# Patient Record
Sex: Male | Born: 1980 | Race: Black or African American | Hispanic: No | Marital: Single | Smoking: Never smoker
Health system: Southern US, Community
[De-identification: ages and names within clinical notes are randomized; demographics above are authoritative.]

## PROBLEM LIST (undated history)

## (undated) DIAGNOSIS — I2699 Other pulmonary embolism without acute cor pulmonale: Secondary | ICD-10-CM

---
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
## 2001-10-26 ENCOUNTER — Emergency Department (HOSPITAL_COMMUNITY): Admission: EM | Admit: 2001-10-26 | Discharge: 2001-10-27 | Payer: Self-pay | Admitting: Emergency Medicine

## 2002-12-09 ENCOUNTER — Emergency Department (HOSPITAL_COMMUNITY): Admission: EM | Admit: 2002-12-09 | Discharge: 2002-12-09 | Payer: Self-pay

## 2010-03-26 ENCOUNTER — Emergency Department (HOSPITAL_COMMUNITY): Admission: EM | Admit: 2010-03-26 | Discharge: 2010-03-26 | Payer: Self-pay | Admitting: Emergency Medicine

## 2010-06-29 ENCOUNTER — Encounter: Payer: Self-pay | Admitting: Cardiothoracic Surgery

## 2013-01-08 ENCOUNTER — Other Ambulatory Visit: Payer: Self-pay | Admitting: Physician Assistant

## 2013-01-08 ENCOUNTER — Ambulatory Visit (HOSPITAL_COMMUNITY)
Admission: RE | Admit: 2013-01-08 | Discharge: 2013-01-08 | Disposition: A | Discharge status: Home / Self Care | Payer: 59 | Source: Ambulatory Visit | Attending: Physician Assistant | Admitting: Physician Assistant

## 2013-01-08 ENCOUNTER — Ambulatory Visit (INDEPENDENT_AMBULATORY_CARE_PROVIDER_SITE_OTHER): Payer: 59 | Admitting: Family Medicine

## 2013-01-08 ENCOUNTER — Other Ambulatory Visit (HOSPITAL_COMMUNITY): Payer: 59

## 2013-01-08 ENCOUNTER — Ambulatory Visit (HOSPITAL_COMMUNITY)
Admission: RE | Admit: 2013-01-08 | Discharge: 2013-01-08 | Disposition: A | Discharge status: Home / Self Care | Payer: 59 | Source: Ambulatory Visit | Attending: Family Medicine | Admitting: Family Medicine

## 2013-01-08 VITALS — BP 143/85 | HR 78 | Temp 98.6°F | Resp 18 | Ht 69.0 in | Wt 185.0 lb

## 2013-01-08 DIAGNOSIS — R8271 Bacteriuria: Secondary | ICD-10-CM

## 2013-01-08 DIAGNOSIS — R82998 Other abnormal findings in urine: Secondary | ICD-10-CM

## 2013-01-08 DIAGNOSIS — R404 Transient alteration of awareness: Secondary | ICD-10-CM | POA: Insufficient documentation

## 2013-01-08 DIAGNOSIS — S0990XA Unspecified injury of head, initial encounter: Secondary | ICD-10-CM

## 2013-01-08 DIAGNOSIS — R52 Pain, unspecified: Secondary | ICD-10-CM

## 2013-01-08 DIAGNOSIS — X58XXXA Exposure to other specified factors, initial encounter: Secondary | ICD-10-CM | POA: Insufficient documentation

## 2013-01-08 DIAGNOSIS — S060X9A Concussion with loss of consciousness of unspecified duration, initial encounter: Secondary | ICD-10-CM

## 2013-01-08 LAB — POCT CBC
Hemoglobin: 16.3 g/dL (ref 14.1–18.1)
Lymph, poc: 1.5 (ref 0.6–3.4)
MCHC: 33 g/dL (ref 31.8–35.4)
MPV: 8.7 fL (ref 0–99.8)
POC Granulocyte: 2.8 (ref 2–6.9)
POC LYMPH PERCENT: 32.3 %L (ref 10–50)
POC MID %: 4.8 %M (ref 0–12)
RDW, POC: 14.1 %

## 2013-01-08 LAB — POCT URINALYSIS DIPSTICK
Glucose, UA: NEGATIVE
Nitrite, UA: NEGATIVE
Urobilinogen, UA: 2

## 2013-01-08 LAB — POCT UA - MICROSCOPIC ONLY
Casts, Ur, LPF, POC: NEGATIVE
Yeast, UA: NEGATIVE

## 2013-01-08 LAB — GLUCOSE, POCT (MANUAL RESULT ENTRY): POC Glucose: 70 mg/dl (ref 70–99)

## 2013-01-08 NOTE — Progress Notes (Signed)
History and physical exam reviewed in detail.  Labs reviewed.  Agree with current assessment and plan.

## 2013-01-08 NOTE — Progress Notes (Signed)
I have examined this patient along with the student and agree.  Discussed with Dr. Katrinka Blazing.

## 2013-01-08 NOTE — Patient Instructions (Signed)
Go to [facility] to the Radiology Department and they will check you in and register you there for your CT scan

## 2013-01-08 NOTE — Progress Notes (Signed)
Subjective:    Patient ID: Stephen Farmer, male    DOB: 1980/06/24, 32 y.o.   MRN: [id number]  HPI 32 y.o. Male presents to clinic today due to fall and loss of consciousness yesterday.   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 He states that his neighbor noticed his door open and was concerned so came over and found him on the ground around 4 pm yesterday afternoon. Patient states that he felt a little out of it and his head hurt. He took 4 tylenol and went back to sleep yesterday until around 10:30 pm.   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 He states that he did not seek care at the time because he did not think that any urgent cares were open at the time and he did not want to go to the ED because he was worried about his insurance not covering it.   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 He has concern as a result that he may have a concussion because this feels similar to ones he has had in the past.   He is not having any neck pain, back pain, nausea, vomiting, loss of bowel control or urination, or changes in vision.   Review of Systems  Constitutional: Negative for fever, chills, activity change, appetite change and fatigue.  HENT: Negative for neck pain and neck stiffness.   Eyes: Negative for visual disturbance.  Gastrointestinal: Negative for nausea and vomiting.  Genitourinary: Negative for difficulty urinating.  Musculoskeletal: Negative for myalgias and back pain.  Neurological: Negative for dizziness, speech difficulty, weakness, light-headedness and headaches.  All other systems reviewed and are negative.       Objective:   Physical Exam  Nursing note and  vitals reviewed. Constitutional: He is oriented to person, place, and time. Vital signs are normal. He appears well-developed and well-nourished. No distress.  HENT:  Head: Normocephalic and atraumatic.  Right Ear: Hearing, tympanic membrane, external ear and ear canal normal.  Left Ear: Hearing, external ear and ear canal normal.  Nose: Nose normal.  Cerumen impaction, left ear. No tenderness of the scalp.  No evidence of trauma.  Eyes: Conjunctivae, EOM and lids are normal. Pupils are equal, round, and reactive to light. Right eye exhibits no nystagmus. Left eye exhibits no nystagmus.  Normal accomodation. Fundoscopic exam normal  Neck: Trachea normal, normal range of motion and full passive range of motion without pain. Neck supple. No spinous process tenderness and no muscular tenderness present. No tracheal deviation and normal range of motion present. No thyromegaly present.  Cardiovascular: Normal rate, regular rhythm and normal heart sounds.   Pulmonary/Chest: Effort normal and breath sounds normal.  Musculoskeletal: Normal range of motion.  Lymphadenopathy:    He has no cervical adenopathy.  Neurological: He is alert and oriented to person, place, and time. He has normal strength and normal reflexes. He displays normal reflexes. No cranial nerve deficit or sensory deficit. He displays a negative Romberg sign.  Skin: Skin is warm, dry and intact. He is not diaphoretic. No pallor.  Psychiatric: He has a normal mood and affect. His speech is  normal and behavior is normal. Judgment and thought content normal. Cognition and memory are not impaired. He exhibits abnormal recent memory. He exhibits normal remote memory.   BP 143/85  Pulse 78  Temp(Src) 98.6 F (37 C) (Oral)  Resp 18  Ht 5\' 9"  (1.753 m)  Wt 185 lb (83.915 kg)  BMI 27.31 kg/m2  SpO2 99%   Results for orders placed in visit on 01/08/13  POCT CBC      Result Value Range   WBC 4.5 (*) 4.6 - 10.2 K/uL   Lymph, poc 1.5   0.6 - 3.4   POC LYMPH PERCENT 32.3  10 - 50 %L   MID (cbc) 0.2  0 - 0.9   POC MID % 4.8  0 - 12 %M   POC Granulocyte 2.8  2 - 6.9   Granulocyte percent 62.9  37 - 80 %G   RBC 4.91  4.69 - 6.13 M/uL   Hemoglobin 16.3  14.1 - 18.1 g/dL   HCT, POC 30.8  65.7 - 53.7 %   MCV 100.7 (*) 80 - 97 fL   MCH, POC 33.2 (*) 27 - 31.2 pg   MCHC 33.0  31.8 - 35.4 g/dL   RDW, POC 84.6     Platelet Count, POC 219  142 - 424 K/uL   MPV 8.7  0 - 99.8 fL  GLUCOSE, POCT (MANUAL RESULT ENTRY)      Result Value Range   POC Glucose 70  70 - 99 mg/dl  POCT UA - MICROSCOPIC ONLY      Result Value Range   WBC, Ur, HPF, POC 3-5     RBC, urine, microscopic 1-3     Bacteria, U Microscopic 2+     Mucus, UA pos     Epithelial cells, urine per micros neg     Crystals, Ur, HPF, POC neg     Casts, Ur, LPF, POC neg     Yeast, UA neg    POCT URINALYSIS DIPSTICK      Result Value Range   Color, UA yellow     Clarity, UA clear     Glucose, UA neg     Bilirubin, UA small     Ketones, UA trace     Spec Grav, UA >=1.030     Blood, UA neg     pH, UA 5.5     Protein, UA neg     Urobilinogen, UA 2.0     Nitrite, UA neg     Leukocytes, UA Negative         Assessment & Plan:   Head injury, unspecified - Plan: POCT CBC, POCT glucose (manual entry), POCT UA - Microscopic Only, POCT urinalysis dipstick, CT Head Wo Contrast.  Concussion with loss of consciousness, initial encounter- Sending patient to get CT. Will contact patient with results.   Bacteria in urine - Plan: Urine culture. Will contact patient with results when they are available  Patient sent over to [facility] for head CT without contrast for further evaluation of LOC. Will contact patient with results once they are available. Patient given note for work for yesterday 8/2 and today 8/3.

## 2013-01-09 LAB — URINE CULTURE
Colony Count: NO GROWTH
Organism ID, Bacteria: NO GROWTH

## 2018-06-23 ENCOUNTER — Ambulatory Visit (INDEPENDENT_AMBULATORY_CARE_PROVIDER_SITE_OTHER): Payer: BLUE CROSS/BLUE SHIELD

## 2018-06-23 ENCOUNTER — Other Ambulatory Visit: Payer: Self-pay

## 2018-06-23 ENCOUNTER — Ambulatory Visit (INDEPENDENT_AMBULATORY_CARE_PROVIDER_SITE_OTHER): Payer: BLUE CROSS/BLUE SHIELD | Admitting: Emergency Medicine

## 2018-06-23 ENCOUNTER — Encounter: Payer: Self-pay | Admitting: Emergency Medicine

## 2018-06-23 VITALS — BP 128/79 | HR 65 | Ht 68.0 in | Wt 186.4 lb

## 2018-06-23 DIAGNOSIS — R079 Chest pain, unspecified: Secondary | ICD-10-CM

## 2018-06-23 NOTE — Progress Notes (Signed)
Stephen Farmer 37 y.o.   Chief Complaint  Patient presents with  . Chest Pain    chest pain started last night, thinks he may have blood clot in the left leg. Having pain for the past 2 wks in the leg    HISTORY OF PRESENT ILLNESS: This is a 38 y.o. male complaining of constant left-sided sharp chest pain without difficulty breathing, nausea or vomiting, diaphoresis, or any other associated symptoms.  Exercises regularly, played basketball yesterday.  Has had some intermittent pain to left calf area for 2 weeks but not keeping him from playing basketball.  No other significant symptoms.  No history of heart disease.  Non-smoker.  HPI   Prior to Admission medications   Medication Sig Start Date End Date Taking? Authorizing Provider  omeprazole (PRILOSEC) 20 MG capsule Take 20 mg by mouth daily.   Yes [provider]    No Known Allergies  There are no active problems to display for this patient.   No past medical history on file.    Social History   Socioeconomic History  . Marital status: Single    Spouse name: Not on file  . Number of children: Not on file  . Years of education: Not on file  . Highest education level: Not on file  Occupational History  . Not on file  Social Needs  . Financial resource strain: Not on file  . Food insecurity:    Worry: Not on file    Inability: Not on file  . Transportation needs:    Medical: Not on file    Non-medical: Not on file  Tobacco Use  . Smoking status: Never Smoker  Substance and Sexual Activity  . Alcohol use: Yes  . Drug use: No  . Sexual activity: Yes  Lifestyle  . Physical activity:    Days per week: Not on file    Minutes per session: Not on file  . Stress: Not on file  Relationships  . Social connections:    Talks on phone: Not on file    Gets together: Not on file    Attends religious service: Not on file    Active member of club or organization: Not on file    Attends meetings of clubs or  organizations: Not on file    Relationship status: Not on file  . Intimate partner violence:    Fear of current or ex partner: Not on file    Emotionally abused: Not on file    Physically abused: Not on file    Forced sexual activity: Not on file  Other Topics Concern  . Not on file  Social History Narrative  . Not on file    Family History  Problem Relation Age of Onset  . Hypertension Mother       Review of Systems  Constitutional: Negative.  Negative for chills and fever.  HENT: Negative.  Negative for congestion, nosebleeds and sore throat.   Eyes: Negative.  Negative for blurred vision and double vision.  Respiratory: Negative.  Negative for cough, hemoptysis, shortness of breath and wheezing.   Cardiovascular: Positive for chest pain. Negative for palpitations, claudication and leg swelling.  Gastrointestinal: Negative.  Negative for abdominal pain, diarrhea, nausea and vomiting.  Genitourinary: Negative.  Negative for dysuria and hematuria.  Musculoskeletal: Negative.  Negative for myalgias and neck pain.  Skin: Negative.  Negative for rash.  Neurological: Negative.  Negative for dizziness and headaches.  Endo/Heme/Allergies: Negative.  Vitals:   06/23/18 1717  BP: 128/79  Pulse: 65  SpO2: 98%    Physical Exam Vitals signs reviewed.  Constitutional:      Appearance: He is well-developed.  HENT:     Head: Normocephalic and atraumatic.     Nose: Nose normal.     Mouth/Throat:     Mouth: Mucous membranes are moist.     Pharynx: Oropharynx is clear.  Eyes:     Extraocular Movements: Extraocular movements intact.     Conjunctiva/sclera: Conjunctivae normal.     Pupils: Pupils are equal, round, and reactive to light.  Neck:     Musculoskeletal: Normal range of motion and neck supple.  Cardiovascular:     Rate and Rhythm: Normal rate and regular rhythm.     Heart sounds: Normal heart sounds.  Pulmonary:     Effort: Pulmonary effort is normal.     Breath  sounds: Normal breath sounds.  Abdominal:     Palpations: Abdomen is soft.     Tenderness: There is no abdominal tenderness.  Musculoskeletal: Normal range of motion.     Comments: Lower extremities: No tenderness, no erythema, no swelling, no signs of DVT.  Skin:    General: Skin is warm and dry.  Neurological:     General: No focal deficit present.     Mental Status: He is alert and oriented to person, place, and time.  Psychiatric:        Mood and Affect: Mood normal.        Behavior: Behavior normal.   EKG: Normal sinus rhythm with ventricular rate of 59.  No acute ischemic changes.  Dg Chest 2 View  Result Date: 06/23/2018 CLINICAL DATA:  Chest pain EXAM: CHEST - 2 VIEW COMPARISON:  None. FINDINGS: Lungs are clear. The heart size and pulmonary vascularity are normal. No adenopathy. No pneumothorax. No bone lesions. IMPRESSION: No edema or consolidation. Electronically Signed   By: Bretta Bang III M.D.   On: 06/23/2018 13:02   A total of 40 minutes was spent in the room with the patient, greater than 50% of which was in counseling/coordination of care regarding differential diagnosis, treatment, prognosis and need for follow-up if no better or worse.  ED precautions given.  ASSESSMENT & PLAN: Stephen Farmer was seen today for chest pain.  Diagnoses and all orders for this visit:  Chest pain, unspecified type -     EKG 12-Lead -     DG Chest 2 View; Future    Patient Instructions       If you have lab work done today you will be contacted with your lab results within the next 2 weeks.  If you have not heard from [place] then please contact us. The fastest way to get your results is to register for My Chart.   IF you received an x-ray today, you will receive an invoice from [facility]. Please contact [facility] at [phone] with questions or concerns regarding your invoice.   IF you received labwork today, you will receive an invoice from [place]. Please  contact [facility] at [phone] with questions or concerns regarding your invoice.   Our billing staff will not be able to assist you with questions regarding bills from these companies.  You will be contacted with the lab results as soon as they are available. The fastest way to get your results is to activate your My Chart account. Instructions are located on the last page of this paperwork. If you have  not heard from [place] regarding the results in 2 weeks, please contact this office.     Nonspecific Chest Pain Chest pain can be caused by many different conditions. Some causes of chest pain can be life-threatening. These will require treatment right away. Serious causes of chest pain include:  Heart attack.  A tear in the body's main blood vessel.  Redness and swelling (inflammation) around your heart.  Blood clot in your lungs. Other causes of chest pain may not be so serious. These include:  Heartburn.  Anxiety or stress.  Damage to bones or muscles in your chest.  Lung infections. Chest pain can feel like:  Pain or discomfort in your chest.  Crushing, pressure, aching, or squeezing pain.  Burning or tingling.  Dull or sharp pain that is worse when you move, cough, or take a deep breath.  Pain or discomfort that is also felt in your back, neck, jaw, shoulder, or arm, or pain that spreads to any of these areas. It is hard to know whether your pain is caused by something that is serious or something that is not so serious. So it is important to see your doctor right away if you have chest pain. Follow these instructions at home: Medicines  Take over-the-counter and prescription medicines only as told by your doctor.  If you were prescribed an antibiotic medicine, take it as told by your doctor. Do not stop taking the antibiotic even if you start to feel better. Lifestyle   Rest as told by your doctor.  Do not use any products that contain nicotine or tobacco, such  as cigarettes, e-cigarettes, and chewing tobacco. If you need help quitting, ask your doctor.  Do not drink alcohol.  Make lifestyle changes as told by your doctor. These may include: ? Getting regular exercise. Ask your doctor what activities are safe for you. ? Eating a heart-healthy diet. A diet and nutrition specialist (dietitian) can help you to learn healthy eating options. ? Staying at a healthy weight. ? Treating diabetes or high blood pressure, if needed. ? Lowering your stress. Activities such as yoga and relaxation techniques can help. General instructions  Pay attention to any changes in your symptoms. Tell your doctor about them or any new symptoms.  Avoid any activities that cause chest pain.  Keep all follow-up visits as told by your doctor. This is important. You may need more testing if your chest pain does not go away. Contact a doctor if:  Your chest pain does not go away.  You feel depressed.  You have a fever. Get help right away if:  Your chest pain is worse.  You have a cough that gets worse, or you cough up blood.  You have very bad (severe) pain in your belly (abdomen).  You pass out (faint).  You have either of these for no clear reason: ? Sudden chest discomfort. ? Sudden discomfort in your arms, back, neck, or jaw.  You have shortness of breath at any time.  You suddenly start to sweat, or your skin gets clammy.  You feel sick to your stomach (nauseous).  You throw up (vomit).  You suddenly feel lightheaded or dizzy.  You feel very weak or tired.  Your heart starts to beat fast, or it feels like it is skipping beats. These symptoms may be an emergency. Do not wait to see if the symptoms will go away. Get medical help right away. Call your local emergency services (911 in the [place] Do  not drive yourself to the hospital. Summary  Chest pain can be caused by many different conditions. The cause may be serious and need treatment right  away. If you have chest pain, see your doctor right away.  Follow your doctor's instructions for taking medicines and making lifestyle changes.  Keep all follow-up visits as told by your doctor. This includes visits for any further testing if your chest pain does not go away.  Be sure to know the signs that show that your condition has become worse. Get help right away if you have these symptoms. This information is not intended to replace advice given to you by your health care provider. Make sure you discuss any questions you have with your health care provider. Document Released: 11/11/2007 Document Revised: 11/25/2017 Document Reviewed: 11/25/2017 Elsevier Interactive Patient Education  2019 [employer] Inc.      Edwina BarthMiguel Dmarius Reeder, MD [facility]

## 2018-06-23 NOTE — Patient Instructions (Addendum)
   If you have lab work done today you will be contacted with your lab results within the next 2 weeks.  If you have not heard from us then please contact us. The fastest way to get your results is to register for My Chart.   IF you received an x-ray today, you will receive an invoice from [facility]. Please contact [facility] at [phone] with questions or concerns regarding your invoice.   IF you received labwork today, you will receive an invoice from [facility]. Please contact [facility] at [phone] with questions or concerns regarding your invoice.   Our billing staff will not be able to assist you with questions regarding bills from these companies.  You will be contacted with the lab results as soon as they are available. The fastest way to get your results is to activate your My Chart account. Instructions are located on the last page of this paperwork. If you have not heard from us regarding the results in 2 weeks, please contact this office.     Nonspecific Chest Pain Chest pain can be caused by many different conditions. Some causes of chest pain can be life-threatening. These will require treatment right away. Serious causes of chest pain include:  Heart attack.  A tear in the body's main blood vessel.  Redness and swelling (inflammation) around your heart.  Blood clot in your lungs. Other causes of chest pain may not be so serious. These include:  Heartburn.  Anxiety or stress.  Damage to bones or muscles in your chest.  Lung infections. Chest pain can feel like:  Pain or discomfort in your chest.  Crushing, pressure, aching, or squeezing pain.  Burning or tingling.  Dull or sharp pain that is worse when you move, cough, or take a deep breath.  Pain or discomfort that is also felt in your back, neck, jaw, shoulder, or arm, or pain that spreads to any of these areas. It is hard to know whether your pain is caused by something that is  serious or something that is not so serious. So it is important to see your doctor right away if you have chest pain. Follow these instructions at home: Medicines  Take over-the-counter and prescription medicines only as told by your doctor.  If you were prescribed an antibiotic medicine, take it as told by your doctor. Do not stop taking the antibiotic even if you start to feel better. Lifestyle   Rest as told by your doctor.  Do not use any products that contain nicotine or tobacco, such as cigarettes, e-cigarettes, and chewing tobacco. If you need help quitting, ask your doctor.  Do not drink alcohol.  Make lifestyle changes as told by your doctor. These may include: ? Getting regular exercise. Ask your doctor what activities are safe for you. ? Eating a heart-healthy diet. A diet and nutrition specialist (dietitian) can help you to learn healthy eating options. ? Staying at a healthy weight. ? Treating diabetes or high blood pressure, if needed. ? Lowering your stress. Activities such as yoga and relaxation techniques can help. General instructions  Pay attention to any changes in your symptoms. Tell your doctor about them or any new symptoms.  Avoid any activities that cause chest pain.  Keep all follow-up visits as told by your doctor. This is important. You may need more testing if your chest pain does not go away. Contact a doctor if:  Your chest pain does not go away.  You feel   depressed.  You have a fever. Get help right away if:  Your chest pain is worse.  You have a cough that gets worse, or you cough up blood.  You have very bad (severe) pain in your belly (abdomen).  You pass out (faint).  You have either of these for no clear reason: ? Sudden chest discomfort. ? Sudden discomfort in your arms, back, neck, or jaw.  You have shortness of breath at any time.  You suddenly start to sweat, or your skin gets clammy.  You feel sick to your stomach  (nauseous).  You throw up (vomit).  You suddenly feel lightheaded or dizzy.  You feel very weak or tired.  Your heart starts to beat fast, or it feels like it is skipping beats. These symptoms may be an emergency. Do not wait to see if the symptoms will go away. Get medical help right away. Call your local emergency services (911 in the [place] Do not drive yourself to the hospital. Summary  Chest pain can be caused by many different conditions. The cause may be serious and need treatment right away. If you have chest pain, see your doctor right away.  Follow your doctor's instructions for taking medicines and making lifestyle changes.  Keep all follow-up visits as told by your doctor. This includes visits for any further testing if your chest pain does not go away.  Be sure to know the signs that show that your condition has become worse. Get help right away if you have these symptoms. This information is not intended to replace advice given to you by your health care provider. Make sure you discuss any questions you have with your health care provider. Document Released: 11/11/2007 Document Revised: 11/25/2017 Document Reviewed: 11/25/2017 Elsevier Interactive Patient Education  2019 [employer].  

## 2018-06-24 ENCOUNTER — Observation Stay (HOSPITAL_COMMUNITY)
Admission: EM | Admit: 2018-06-24 | Discharge: 2018-06-25 | Disposition: A | Discharge status: Home / Self Care | Payer: BLUE CROSS/BLUE SHIELD | Attending: Internal Medicine | Admitting: Internal Medicine

## 2018-06-24 ENCOUNTER — Emergency Department (HOSPITAL_BASED_OUTPATIENT_CLINIC_OR_DEPARTMENT_OTHER): Payer: BLUE CROSS/BLUE SHIELD

## 2018-06-24 ENCOUNTER — Ambulatory Visit: Payer: BLUE CROSS/BLUE SHIELD | Admitting: Emergency Medicine

## 2018-06-24 ENCOUNTER — Encounter: Payer: Self-pay | Admitting: Emergency Medicine

## 2018-06-24 ENCOUNTER — Other Ambulatory Visit: Payer: Self-pay

## 2018-06-24 ENCOUNTER — Emergency Department (HOSPITAL_COMMUNITY): Payer: BLUE CROSS/BLUE SHIELD

## 2018-06-24 ENCOUNTER — Encounter (HOSPITAL_COMMUNITY): Payer: Self-pay | Admitting: Emergency Medicine

## 2018-06-24 VITALS — BP 109/73 | HR 64 | Temp 98.7°F | Resp 16 | Ht 68.0 in | Wt 184.6 lb

## 2018-06-24 DIAGNOSIS — R079 Chest pain, unspecified: Secondary | ICD-10-CM

## 2018-06-24 DIAGNOSIS — Z79899 Other long term (current) drug therapy: Secondary | ICD-10-CM | POA: Insufficient documentation

## 2018-06-24 DIAGNOSIS — I2699 Other pulmonary embolism without acute cor pulmonale: Secondary | ICD-10-CM | POA: Diagnosis not present

## 2018-06-24 DIAGNOSIS — R9431 Abnormal electrocardiogram [ECG] [EKG]: Secondary | ICD-10-CM | POA: Diagnosis not present

## 2018-06-24 DIAGNOSIS — I517 Cardiomegaly: Secondary | ICD-10-CM | POA: Diagnosis not present

## 2018-06-24 DIAGNOSIS — I82442 Acute embolism and thrombosis of left tibial vein: Secondary | ICD-10-CM | POA: Diagnosis not present

## 2018-06-24 DIAGNOSIS — R918 Other nonspecific abnormal finding of lung field: Secondary | ICD-10-CM | POA: Diagnosis not present

## 2018-06-24 DIAGNOSIS — R0602 Shortness of breath: Secondary | ICD-10-CM | POA: Diagnosis not present

## 2018-06-24 LAB — CBC
HEMATOCRIT: 48.1 % (ref 39.0–52.0)
HEMOGLOBIN: 16 g/dL (ref 13.0–17.0)
MCH: 32.6 pg (ref 26.0–34.0)
MCHC: 33.3 g/dL (ref 30.0–36.0)
MCV: 98 fL (ref 80.0–100.0)
Platelets: 218 10*3/uL (ref 150–400)
RBC: 4.91 MIL/uL (ref 4.22–5.81)
RDW: 13.1 % (ref 11.5–15.5)
WBC: 5.8 10*3/uL (ref 4.0–10.5)
nRBC: 0 % (ref 0.0–0.2)

## 2018-06-24 LAB — D-DIMER, QUANTITATIVE: D-Dimer, Quant: 3.93 ug/mL-FEU — ABNORMAL HIGH (ref 0.00–0.50)

## 2018-06-24 LAB — APTT: aPTT: 30 seconds (ref 24–36)

## 2018-06-24 LAB — BASIC METABOLIC PANEL
Anion gap: 11 (ref 5–15)
BUN: 14 mg/dL (ref 6–20)
CHLORIDE: 99 mmol/L (ref 98–111)
CO2: 28 mmol/L (ref 22–32)
CREATININE: 1.21 mg/dL (ref 0.61–1.24)
Calcium: 9.1 mg/dL (ref 8.9–10.3)
GFR calc Af Amer: >60 mL/min (ref >60)
GFR calc non Af Amer: >60 mL/min (ref >60)
GLUCOSE: 93 mg/dL (ref 70–99)
POTASSIUM: 3.8 mmol/L (ref 3.5–5.1)
SODIUM: 138 mmol/L (ref 135–145)

## 2018-06-24 LAB — I-STAT TROPONIN, ED: Troponin i, poc: 0.02 ng/mL (ref 0.00–0.08)

## 2018-06-24 LAB — PROTIME-INR
INR: 0.95
Prothrombin Time: 12.6 seconds (ref 11.4–15.2)

## 2018-06-24 MED ORDER — IBUPROFEN 200 MG PO TABS
600.0000 mg | ORAL_TABLET | Freq: Once | ORAL | Status: AC
  Administered 2018-06-24: 600 mg via ORAL
  Filled 2018-06-24: qty 3

## 2018-06-24 MED ORDER — APIXABAN 5 MG PO TABS: 5.0000 mg | ORAL_TABLET | Freq: Two times a day (BID) | ORAL | Status: DC

## 2018-06-24 MED ORDER — SODIUM CHLORIDE 0.9 % IV BOLUS
1000.0000 mL | Freq: Once | INTRAVENOUS | Status: AC
  Administered 2018-06-24: 1000 mL via INTRAVENOUS

## 2018-06-24 MED ORDER — SODIUM CHLORIDE 0.9 % IV SOLN
INTRAVENOUS | Status: DC
  Administered 2018-06-24: 20:00:00 via INTRAVENOUS

## 2018-06-24 MED ORDER — ACETAMINOPHEN 325 MG PO TABS
650.0000 mg | ORAL_TABLET | Freq: Four times a day (QID) | ORAL | Status: DC | PRN
  Administered 2018-06-25: 650 mg via ORAL
  Filled 2018-06-24: qty 2

## 2018-06-24 MED ORDER — ACETAMINOPHEN 650 MG RE SUPP: 650.0000 mg | Freq: Four times a day (QID) | RECTAL | Status: DC | PRN

## 2018-06-24 MED ORDER — ONDANSETRON HCL 4 MG PO TABS: 4.0000 mg | ORAL_TABLET | Freq: Four times a day (QID) | ORAL | Status: DC | PRN

## 2018-06-24 MED ORDER — SODIUM CHLORIDE (PF) 0.9 % IJ SOLN
INTRAMUSCULAR | Status: AC
  Filled 2018-06-24: qty 50

## 2018-06-24 MED ORDER — APIXABAN 5 MG PO TABS
10.0000 mg | ORAL_TABLET | Freq: Two times a day (BID) | ORAL | Status: DC
  Administered 2018-06-24 – 2018-06-25 (×2): 10 mg via ORAL
  Filled 2018-06-24 (×2): qty 2

## 2018-06-24 MED ORDER — POLYETHYLENE GLYCOL 3350 17 G PO PACK: 17.0000 g | PACK | Freq: Every day | ORAL | Status: DC | PRN

## 2018-06-24 MED ORDER — IOPAMIDOL (ISOVUE-370) INJECTION 76%
INTRAVENOUS | Status: AC
  Filled 2018-06-24: qty 100

## 2018-06-24 MED ORDER — HYDROCODONE-ACETAMINOPHEN 5-325 MG PO TABS
1.0000 | ORAL_TABLET | ORAL | Status: DC | PRN
  Administered 2018-06-24 – 2018-06-25 (×3): 1 via ORAL
  Filled 2018-06-24 (×3): qty 1

## 2018-06-24 MED ORDER — HEPARIN BOLUS VIA INFUSION
5000.0000 [IU] | Freq: Once | INTRAVENOUS | Status: AC
  Administered 2018-06-24: 5000 [IU] via INTRAVENOUS
  Filled 2018-06-24: qty 5000

## 2018-06-24 MED ORDER — POLYVINYL ALCOHOL 1.4 % OP SOLN: 1.0000 [drp] | OPHTHALMIC | Status: DC | PRN

## 2018-06-24 MED ORDER — ONDANSETRON HCL 4 MG/2ML IJ SOLN: 4.0000 mg | Freq: Four times a day (QID) | INTRAMUSCULAR | Status: DC | PRN

## 2018-06-24 MED ORDER — IOPAMIDOL (ISOVUE-370) INJECTION 76%
100.0000 mL | Freq: Once | INTRAVENOUS | Status: AC | PRN
  Administered 2018-06-24: 100 mL via INTRAVENOUS

## 2018-06-24 MED ORDER — FENTANYL CITRATE (PF) 100 MCG/2ML IJ SOLN
50.0000 ug | Freq: Once | INTRAMUSCULAR | Status: DC | PRN
  Filled 2018-06-24: qty 2

## 2018-06-24 MED ORDER — HEPARIN (PORCINE) 25000 UT/250ML-% IV SOLN
1450.0000 [IU]/h | INTRAVENOUS | Status: AC
  Administered 2018-06-24: 1450 [IU]/h via INTRAVENOUS
  Filled 2018-06-24: qty 250

## 2018-06-24 NOTE — Progress Notes (Signed)
Stephen Farmer 38 y.o.   Chief Complaint  Patient presents with  . Chest Pain    upper LEFT side - CONTINUE TO HAVE PAIN  . Shortness of Breath    NOT BETTER    HISTORY OF PRESENT ILLNESS: This is a 38 y.o. male seen by me yesterday with similar symptoms.  Has been having left-sided sharp constant pleuritic chest pain since last Wednesday night.  Return from trip to [facility] last Tuesday.  Had been complaining of left calf pain, better now.  Able to play basketball and workout.  No cardiac history.  Symptoms not better in the last 24 hours and having a feeling of shortness of breath.  Had an episode of fever with night sweats 2 nights ago.  Non-smoker.  No history of asthma or COPD.  Chest x-ray done yesterday looked normal.  EKG done yesterday unremarkable.  HPI   Prior to Admission medications   Medication Sig Start Date End Date Taking? Authorizing Provider  omeprazole (PRILOSEC) 20 MG capsule Take 20 mg by mouth daily.   Yes [provider]    No Known Allergies  Patient Active Problem List   Diagnosis Date Noted  . Chest pain 06/23/2018    No past medical history on file.  No past surgical history on file.  Social History   Socioeconomic History  . Marital status: Single    Spouse name: Not on file  . Number of children: Not on file  . Years of education: Not on file  . Highest education level: Not on file  Occupational History  . Not on file  Social Needs  . Financial resource strain: Not on file  . Food insecurity:    Worry: Not on file    Inability: Not on file  . Transportation needs:    Medical: Not on file    Non-medical: Not on file  Tobacco Use  . Smoking status: Never Smoker  . Smokeless tobacco: Never Used  Substance and Sexual Activity  . Alcohol use: Yes  . Drug use: No  . Sexual activity: Yes  Lifestyle  . Physical activity:    Days per week: Not on file    Minutes per session: Not on file  . Stress: Not on file  Relationships   . Social connections:    Talks on phone: Not on file    Gets together: Not on file    Attends religious service: Not on file    Active member of club or organization: Not on file    Attends meetings of clubs or organizations: Not on file    Relationship status: Not on file  . Intimate partner violence:    Fear of current or ex partner: Not on file    Emotionally abused: Not on file    Physically abused: Not on file    Forced sexual activity: Not on file  Other Topics Concern  . Not on file  Social History Narrative  . Not on file    Family History  Problem Relation Age of Onset  . Hypertension Mother      Review of Systems  Constitutional: Negative.  Negative for chills and fever.  HENT: Negative.  Negative for sore throat.   Eyes: Negative.  Negative for blurred vision and double vision.  Respiratory: Positive for shortness of breath. Negative for cough.   Cardiovascular: Positive for chest pain. Negative for palpitations.  Gastrointestinal: Negative.  Negative for abdominal pain, diarrhea, nausea and vomiting.  Genitourinary:  Negative.   Musculoskeletal: Negative.   Skin: Negative.  Negative for rash.  Neurological: Negative.  Negative for dizziness and headaches.  Endo/Heme/Allergies: Negative.   All other systems reviewed and are negative.   Vitals:   06/24/18 1037  BP: 109/73  Pulse: 64  Resp: 16  Temp: 98.7 F (37.1 C)  SpO2: 100%    Physical Exam Vitals signs reviewed.  Constitutional:      Appearance: He is well-developed.  HENT:     Head: Normocephalic and atraumatic.     Nose: Nose normal.     Mouth/Throat:     Mouth: Mucous membranes are moist.     Pharynx: Oropharynx is clear.  Eyes:     Extraocular Movements: Extraocular movements intact.     Pupils: Pupils are equal, round, and reactive to light.  Neck:     Musculoskeletal: Normal range of motion and neck supple.  Cardiovascular:     Rate and Rhythm: Normal rate and regular rhythm.      Pulses: Normal pulses.     Heart sounds: Normal heart sounds.  Abdominal:     Palpations: Abdomen is soft.     Tenderness: There is no abdominal tenderness.  Musculoskeletal: Normal range of motion.  Skin:    General: Skin is warm and dry.     Capillary Refill: Capillary refill takes less than 2 seconds.  Neurological:     General: No focal deficit present.     Mental Status: He is alert and oriented to person, place, and time.  Psychiatric:        Mood and Affect: Mood normal.        Behavior: Behavior normal.      ASSESSMENT & PLAN: Britt Bottomlvin was seen today for chest pain and shortness of breath.  Diagnoses and all orders for this visit:  Chest pain, unspecified type Comments: Rule out pulmonary embolism Orders: -     EKG 12-Lead   Patient Instructions    Go to [facility] emergency department now for further evaluation to rule out pulmonary embolism.   If you have lab work done today you will be contacted with your lab results within the next 2 weeks.  If you have not heard from us then please contact us. The fastest way to get your results is to register for My Chart.   IF you received an x-ray today, you will receive an invoice from [facility]. Please contact [facility] at [phone] with questions or concerns regarding your invoice.   IF you received labwork today, you will receive an invoice from [facility]. Please contact [facility] at [phone] with questions or concerns regarding your invoice.   Our billing staff will not be able to assist you with questions regarding bills from these companies.  You will be contacted with the lab results as soon as they are available. The fastest way to get your results is to activate your My Chart account. Instructions are located on the last page of this paperwork. If you have not heard from us regarding the results in 2 weeks, please contact this office.         Edwina BarthMiguel Ikesha Siller, MD [facility]

## 2018-06-24 NOTE — ED Triage Notes (Signed)
Pt reports that had leg pains couple weeks ago and for past couple days has SOB and left side chest pains when taking deep breaths. Was sent to ED to rule out PE.

## 2018-06-24 NOTE — Progress Notes (Addendum)
ANTICOAGULATION CONSULT NOTE  Pharmacy Consult for heparin Indication: pulmonary embolus and DVT  No Known Allergies  Patient Measurements:   Heparin Dosing Weight: 83.7 kg  Vital Signs: Temp: 97.7 F (36.5 C) (01/17 1153) Temp Source: Oral (01/17 1153) BP: 138/87 (01/17 1501) Pulse Rate: 61 (01/17 1501)  Labs: Recent Labs    06/24/18 1219  HGB 16.0  HCT 48.1  PLT 218  CREATININE 1.21    Estimated Creatinine Clearance: 88.1 mL/min (by C-G formula based on SCr of 1.21 mg/dL).   Medical History: History reviewed. No pertinent past medical history.  Medications:  No anticoagulants PTA  Assessment: 38 y/o M with non significant PMH admitted with DVT/PE. Patient may be a candidate for outpatient therapy with DOAC as PE without severe symptoms or right heart strain. Plan per [facility] to initiate heparin drip then evaluate for possible DOAC to home.   Goal of Therapy:  Heparin level 0.3-0.7 units/ml Monitor platelets by anticoagulation protocol: Yes   Plan:  Give 4000 units bolus x 1 Start heparin infusion at 1450 units/hr Check anti-Xa level in 6 hours and daily while on heparin Continue to monitor H&H and platelets   06/24/18 5:42 PM Transition to Eliquis 10 mg bid x 7 days then 5 mg bid thereafter. RN instructed (to pass along to floor/night shift RN) to stop heparin drip at time of first dose.   Luisa Hart D 06/24/2018,3:03 PM

## 2018-06-24 NOTE — ED Notes (Signed)
ED TO INPATIENT HANDOFF REPORT  Name/Age/Gender Stephen Farmer 38 y.o. male  Code Status    Code Status Orders  (From admission, onward)         Start     Ordered   06/24/18 1841  Full code  Continuous     06/24/18 1840        Code Status History    This patient has a current code status but no historical code status.      Home/SNF/Other Home  Chief Complaint pcp sent pt for shortness of breath  Level of Care/Admitting Diagnosis ED Disposition    ED Disposition Condition Comment   Admit  Hospital Area: [facility] [[id number]]  Level of Care: Telemetry [5]  Admit to tele based on following criteria: Complex arrhythmia (Bradycardia/Tachycardia)  Diagnosis: Pulmonary embolism [facility]) [[id number]]  Admitting Physician: Joycelyn Das [[id number]]  Attending Physician: Joycelyn Das [[id number]]  PT Class (Do Not Modify): Observation [104]  PT Acc Code (Do Not Modify): Observation [10022]       Medical History History reviewed. No pertinent past medical history.  Allergies No Known Allergies  IV Location/Drains/Wounds Patient Lines/Drains/Airways Status   Active Line/Drains/Airways    Name:   Placement date:   Placement time:   Site:   Days:   Peripheral IV 06/24/18 Left Antecubital   06/24/18    1311    Antecubital   less than 1          Labs/Imaging Results for orders placed or performed during the hospital encounter of 06/24/18 (from the past 48 hour(s))  Basic metabolic panel     Status: None   Collection Time: 06/24/18 12:19 PM  Result Value Ref Range   Sodium 138 135 - 145 mmol/L   Potassium 3.8 3.5 - 5.1 mmol/L   Chloride 99 98 - 111 mmol/L   CO2 28 22 - 32 mmol/L   Glucose, Bld 93 70 - 99 mg/dL   BUN 14 6 - 20 mg/dL   Creatinine, Ser 0.24 0.61 - 1.24 mg/dL   Calcium 9.1 8.9 - 09.7 mg/dL   GFR calc non Af Amer >60 >60 mL/min   GFR calc Af Amer >60 >60 mL/min   Anion gap 11 5 - 15    Comment: Performed at [facility], [address] [place], [place] [address]  CBC     Status: None   Collection Time: 06/24/18 12:19 PM  Result Value Ref Range   WBC 5.8 4.0 - 10.5 K/uL   RBC 4.91 4.22 - 5.81 MIL/uL   Hemoglobin 16.0 13.0 - 17.0 g/dL   HCT 92.4 26.8 - 34.1 %   MCV 98.0 80.0 - 100.0 fL   MCH 32.6 26.0 - 34.0 pg   MCHC 33.3 30.0 - 36.0 g/dL   RDW 96.2 22.9 - 79.8 %   Platelets 218 150 - 400 K/uL   nRBC 0.0 0.0 - 0.2 %    Comment: Performed at [facility], [address], [place], [place] [address]  D-dimer, quantitative (not at [facility])     Status: Abnormal   Collection Time: 06/24/18 12:19 PM  Result Value Ref Range   D-Dimer, Quant 3.93 (H) 0.00 - 0.50 ug/mL-FEU    Comment: (NOTE) At the manufacturer cut-off of 0.50 ug/mL FEU, this assay has been documented to exclude PE with a sensitivity and negative predictive value of 97 to 99%.  At this time, this assay has not been approved by the  FDA to exclude DVT/VTE. Results should be correlated with clinical presentation. Performed at [facility], [address] [place], [place] [address]   APTT     Status: None   Collection Time: 06/24/18 12:19 PM  Result Value Ref Range   aPTT 30 24 - 36 seconds    Comment: Performed at [facility], [address] [place], [place] [address]  Protime-INR     Status: None   Collection Time: 06/24/18 12:19 PM  Result Value Ref Range   Prothrombin Time 12.6 11.4 - 15.2 seconds   INR 0.95     Comment: Performed at [facility], [address] [place], [place] [address]  I-stat troponin, ED     Status: None   Collection Time: 06/24/18 12:22 PM  Result Value Ref Range   Troponin i, poc 0.02 0.00 - 0.08 ng/mL   Comment 3            Comment: Due to the release kinetics of cTnI, a negative result within the first hours of the onset of symptoms does not rule out myocardial infarction with certainty. If myocardial infarction is  still suspected, repeat the test at appropriate intervals.    Dg Chest 2 View  Result Date: 06/23/2018 CLINICAL DATA:  Chest pain EXAM: CHEST - 2 VIEW COMPARISON:  None. FINDINGS: Lungs are clear. The heart size and pulmonary vascularity are normal. No adenopathy. No pneumothorax. No bone lesions. IMPRESSION: No edema or consolidation. Electronically Signed   By: Bretta BangWilliam  Woodruff III M.D.   On: 06/23/2018 13:02   Ct Angio Chest Pe W/cm &/or Wo Cm  Result Date: 06/24/2018 CLINICAL DATA:  Shortness of breath and chest pain EXAM: CT ANGIOGRAPHY CHEST WITH CONTRAST TECHNIQUE: Multidetector CT imaging of the chest was performed using the standard protocol during bolus administration of intravenous contrast. Multiplanar CT image reconstructions and MIPs were obtained to evaluate the vascular anatomy. CONTRAST:  88 mL ISOVUE-370 IOPAMIDOL (ISOVUE-370) INJECTION 76% COMPARISON:  None. FINDINGS: Cardiovascular: There are pulmonary emboli extending from the right intralobar pulmonary artery into several right lower lobe pulmonary artery branches. There are also multiple left lower lobe pulmonary artery branches containing pulmonary emboli. The right ventricle to left ventricle diameter ratio is less than 0.9, not consistent with right heart strain. No thoracic aortic aneurysm or dissection. Visualized great vessels appear unremarkable. No pericardial effusion or pericardial thickening evident. Mediastinum/Nodes: Thyroid appears normal. There is no appreciable thoracic adenopathy. No esophageal lesions are evident. Lungs/Pleura: There is mild bibasilar atelectatic change. There is airspace consolidation along the periphery of the left lower lobe involving portions of the anterior and lateral segments. Lungs elsewhere are clear. No pleural effusion or pleural thickening. Upper Abdomen: There is slight reflux of contrast into the inferior vena cava. Visualized upper abdominal structures otherwise appear unremarkable.  Musculoskeletal: There are no appreciable blastic or lytic bone lesions. No chest wall lesions are evident. Review of the MIP images confirms the above findings. IMPRESSION: 1. Lower lobe pulmonary emboli bilaterally involving several lower lobe pulmonary branches bilaterally. There is also embolus in a portion of the right inter lobar pulmonary artery, arising at the junction of the right main and right intralobar pulmonary arteries. No more central pulmonary emboli evident. No right heart strain demonstrable. 2. Opacification along the periphery of the left lower lobe involving portions of the anterior and lateral segments. This opacity is not wedge-shaped. Suspect pneumonia as more likely than pulmonary infarct. A degree of pulmonary  infarct superimposed on pneumonia is certainly possible. There is bibasilar atelectasis posteriorly as well. 3.  No thoracic adenopathy. 4. Slight reflux of contrast into the inferior vena cava may indicate a degree of increase in right heart pressure. 5.  No thoracic aortic aneurysm or dissection. Critical Value/emergent results were called by telephone at the time of interpretation on 06/24/2018 at 2:34 pm to Dr. Pricilla Loveless , who verbally acknowledged these results. Electronically Signed   By: Bretta Bang III M.D.   On: 06/24/2018 14:34   Vas [place] Lower Extremity Venous (dvt) (mc And Wl 7a-7p)  Result Date: 06/24/2018  Lower Venous Study Indications: Pulmonary embolism.  Performing Technologist: Chanda Busing RVT  Examination Guidelines: A complete evaluation includes B-mode imaging, spectral Doppler, color Doppler, and power Doppler as needed of all accessible portions of each vessel. Bilateral testing is considered an integral part of a complete examination. Limited examinations for reoccurring indications may be performed as noted.  Right Venous Findings: +---+---------------+---------+-----------+----------+-------+     CompressibilityPhasicitySpontaneityPropertiesSummary +---+---------------+---------+-----------+----------+-------+ CFVFull           Yes      Yes                          +---+---------------+---------+-----------+----------+-------+  Left Venous Findings: +---------+---------------+---------+-----------+----------+-------+          CompressibilityPhasicitySpontaneityPropertiesSummary +---------+---------------+---------+-----------+----------+-------+ CFV      Full           Yes      Yes                          +---------+---------------+---------+-----------+----------+-------+ SFJ      Full                                                 +---------+---------------+---------+-----------+----------+-------+ FV Prox  Full                                                 +---------+---------------+---------+-----------+----------+-------+ FV Mid   Full                                                 +---------+---------------+---------+-----------+----------+-------+ FV DistalFull                                                 +---------+---------------+---------+-----------+----------+-------+ PFV      Full                                                 +---------+---------------+---------+-----------+----------+-------+ POP      Full           Yes      Yes                          +---------+---------------+---------+-----------+----------+-------+  PTV      None                                         Acute   +---------+---------------+---------+-----------+----------+-------+ PERO     Full                                                 +---------+---------------+---------+-----------+----------+-------+    Summary: Right: No evidence of common femoral vein obstruction. Left: Findings consistent with acute deep vein thrombosis involving the left posterior tibial vein. No cystic structure found in the popliteal fossa.  *See  table(s) above for measurements and observations.    Preliminary    EKG Interpretation  Date/Time:  Friday June 24 2018 11:52:55 EST Ventricular Rate:  67 PR Interval:    QRS Duration: 92 QT Interval:  379 QTC Calculation: 400 R Axis:   20 Text Interpretation:  Sinus rhythm RSR' in V1 or V2, probably normal variant Consider left ventricular hypertrophy ST elev, probable normal early repol pattern no reciprocal changes No old tracing to compare Confirmed by Pricilla LovelessGoldston, Scott [phone]) on 06/24/2018 12:53:53 PM   Pending Labs Unresulted Labs (From admission, onward)    Start     Ordered   06/25/18 0500  Basic metabolic panel  Tomorrow morning,   R     06/24/18 1840   06/25/18 0500  CBC  Tomorrow morning,   R     06/24/18 1840   06/24/18 1841  HIV antibody (Routine Testing)  Once,   R     06/24/18 1840          Vitals/Pain Today's Vitals   06/24/18 1800 06/24/18 1830 06/24/18 1930 06/24/18 1939  BP: 135/90 (!) 135/99 128/87   Pulse: 65 63 (!) 39 69  Resp: 19 19 20    Temp:      TempSrc:      SpO2: 98% 98% 95%   PainSc:        Isolation Precautions No active isolations  Medications Medications  sodium chloride (PF) 0.9 % injection (has no administration in time range)  iopamidol (ISOVUE-370) 76 % injection (has no administration in time range)  heparin ADULT infusion 100 units/mL (25000 units/25750mL sodium chloride 0.45%) (1,450 Units/hr Intravenous New Bag/Given 06/24/18 1548)  polyvinyl alcohol (LIQUIFILM TEARS) 1.4 % ophthalmic solution 1 drop (has no administration in time range)  0.9 %  sodium chloride infusion (has no administration in time range)  acetaminophen (TYLENOL) tablet 650 mg (has no administration in time range)    Or  acetaminophen (TYLENOL) suppository 650 mg (has no administration in time range)  HYDROcodone-acetaminophen (NORCO/VICODIN) 5-325 MG per tablet 1-2 tablet (has no administration in time range)  polyethylene glycol (MIRALAX / GLYCOLAX)  packet 17 g (has no administration in time range)  ondansetron (ZOFRAN) tablet 4 mg (has no administration in time range)    Or  ondansetron (ZOFRAN) injection 4 mg (has no administration in time range)  apixaban (ELIQUIS) tablet 10 mg (has no administration in time range)    Followed by  apixaban (ELIQUIS) tablet 5 mg (has no administration in time range)  ibuprofen (ADVIL,MOTRIN) tablet 600 mg (600 mg Oral Given 06/24/18 1310)  sodium chloride 0.9 % bolus 1,000 mL (0 mLs Intravenous Stopped 06/24/18 1416)  iopamidol (ISOVUE-370) 76 % injection 100 mL (100 mLs Intravenous Contrast Given 06/24/18 1414)  heparin bolus via infusion 5,000 Units (5,000 Units Intravenous Bolus from Bag 06/24/18 1547)    Mobility walks

## 2018-06-24 NOTE — Progress Notes (Signed)
Left lower extremity venous duplex has been completed. There is evidence of acute deep vein thrombosis involving the posterior tibial veins of the left lower extremity. Results were given to Dr. Criss Alvine.  06/24/18 3:17 PM Olen Cordial RVT

## 2018-06-24 NOTE — Patient Instructions (Addendum)
  Go to [facility] emergency department now for further evaluation to rule out pulmonary embolism.   If you have lab work done today you will be contacted with your lab results within the next 2 weeks.  If you have not heard from [place] then please contact us. The fastest way to get your results is to register for My Chart.   IF you received an x-ray today, you will receive an invoice from [facility]. Please contact [facility] at [phone] with questions or concerns regarding your invoice.   IF you received labwork today, you will receive an invoice from [facility]. Please contact [facility] at [phone] with questions or concerns regarding your invoice.   Our billing staff will not be able to assist you with questions regarding bills from these companies.  You will be contacted with the lab results as soon as they are available. The fastest way to get your results is to activate your My Chart account. Instructions are located on the last page of this paperwork. If you have not heard from [place] regarding the results in 2 weeks, please contact this office.

## 2018-06-24 NOTE — H&P (Signed)
[facility] History and Physical  Stephen Farmer GDJ:[id number] DOB: August 31, 1980 DOA: 06/24/2018  Referring physician: ED  PCP: Patient, No Pcp Per   Chief Complaint: Leg pain, chest pain  HPI: Stephen Farmer is a 38 y.o. male with no significant past medical history presented to the hospital with complaints of chest pain.  Patient had initially gone to urgent care for the same and was sent to our hospital with suspicion for PE.  He reported left-sided constant chest pain which was sharp in nature for the last 2 days which was around 3/ 10 in intensity.  Had recently traveled to [place] a week back and before flew to [place] he was having some discomfort in his leg but it did not bother him much so did not stop his activity.  Patient denied any fever, chills or rigor.  He had mild shortness of breath yesterday with some diaphoresis and feeling of being feverish.  Noticed chest pain especially on taking a deep breath.  Patient denies any hemoptysis or cough with sputum production.  Patient denied any urinary urgency, frequency or dysuria.  Denies any sick contacts.  Denies any syncope or loss of consciousness.  Denies any dizziness or lightheadedness.  Has any nausea, vomiting or abdominal pain.  No mention of constipation or diarrhea.  No prior history of DVT or pulmonary embolism.  No family history of DVT  ED Course: In the ED, patient had a CT scan of the chest which showed evidence of bilateral lower lobe pulmonary emboli with embolus in the portion of the right interlobar pulmonary artery arising at the junction of the right main.  There was no right heart strain demonstrated.  There was some opacification over the periphery of the left lower lobe as well so the patient was considered for admission to the hospital for further evaluation and observation.  Review of Systems:  All systems were reviewed and were negative unless otherwise mentioned in the HPI  History reviewed. No  pertinent past medical history. History reviewed. No pertinent surgical history.  Social History:  reports that he has never smoked. He has never used smokeless tobacco. He reports current alcohol use. He reports that he does not use drugs.  No Known Allergies  Family History  Problem Relation Age of Onset  . Hypertension Mother      Prior to Admission medications   Medication Sig Start Date End Date Taking? Authorizing Provider  polyvinyl alcohol (LIQUIFILM TEARS) 1.4 % ophthalmic solution Place 1 drop into both eyes as needed for dry eyes.   Yes [provider]    Physical Exam: Vitals:   06/24/18 1153  BP: (!) 133/98  Pulse: 67  Resp: 16  Temp: 97.7 F (36.5 C)  TempSrc: Oral  SpO2: 99%   Wt Readings from Last 3 Encounters:  06/24/18 83.7 kg  06/23/18 84.6 kg  01/08/13 83.9 kg   There is no height or weight on file to calculate BMI.  General:  Average built, not in obvious distress, HENT: Normocephalic, pupils equally reacting to light and accommodation.  No scleral pallor or icterus noted. Oral mucosa is moist.  Chest:  Clear breath sounds.  Diminished breath sounds bilaterally. No crackles or wheezes.  CVS: S1 &S2 heard. No murmur.  Regular rate and rhythm. Abdomen: Soft, nontender, nondistended.  Bowel sounds are heard.  Liver is not palpable, no abdominal mass palpated Extremities: No cyanosis, clubbing or edema.  Peripheral pulses are palpable. Psych: Alert, awake and oriented,  normal mood CNS:  No cranial nerve deficits.  Power equal in all extremities.   No cerebellar signs.   Skin: Warm and dry.  No rashes noted.  Labs on Admission:  Basic Metabolic Panel: Recent Labs  Lab 06/24/18 1219  NA 138  K 3.8  CL 99  CO2 28  GLUCOSE 93  BUN 14  CREATININE 1.21  CALCIUM 9.1   Liver Function Tests: No results for input(s): AST, ALT, ALKPHOS, BILITOT, PROT, ALBUMIN in the last 168 hours. No results for input(s): LIPASE, AMYLASE in the last 168  hours. No results for input(s): AMMONIA in the last 168 hours. CBC: Recent Labs  Lab 06/24/18 1219  WBC 5.8  HGB 16.0  HCT 48.1  MCV 98.0  PLT 218   Cardiac Enzymes: No results for input(s): CKTOTAL, CKMB, CKMBINDEX, TROPONINI in the last 168 hours.  BNP (last 3 results) No results for input(s): BNP in the last 8760 hours.  ProBNP (last 3 results) No results for input(s): PROBNP in the last 8760 hours.  CBG: No results for input(s): GLUCAP in the last 168 hours.   Radiological Exams on Admission: Dg Chest 2 View  Result Date: 06/23/2018 CLINICAL DATA:  Chest pain EXAM: CHEST - 2 VIEW COMPARISON:  None. FINDINGS: Lungs are clear. The heart size and pulmonary vascularity are normal. No adenopathy. No pneumothorax. No bone lesions. IMPRESSION: No edema or consolidation. Electronically Signed   By: Bretta BangWilliam  Woodruff III M.D.   On: 06/23/2018 13:02   Ct Angio Chest Pe W/cm &/or Wo Cm  Result Date: 06/24/2018 CLINICAL DATA:  Shortness of breath and chest pain EXAM: CT ANGIOGRAPHY CHEST WITH CONTRAST TECHNIQUE: Multidetector CT imaging of the chest was performed using the standard protocol during bolus administration of intravenous contrast. Multiplanar CT image reconstructions and MIPs were obtained to evaluate the vascular anatomy. CONTRAST:  88 mL ISOVUE-370 IOPAMIDOL (ISOVUE-370) INJECTION 76% COMPARISON:  None. FINDINGS: Cardiovascular: There are pulmonary emboli extending from the right intralobar pulmonary artery into several right lower lobe pulmonary artery branches. There are also multiple left lower lobe pulmonary artery branches containing pulmonary emboli. The right ventricle to left ventricle diameter ratio is less than 0.9, not consistent with right heart strain. No thoracic aortic aneurysm or dissection. Visualized great vessels appear unremarkable. No pericardial effusion or pericardial thickening evident. Mediastinum/Nodes: Thyroid appears normal. There is no appreciable  thoracic adenopathy. No esophageal lesions are evident. Lungs/Pleura: There is mild bibasilar atelectatic change. There is airspace consolidation along the periphery of the left lower lobe involving portions of the anterior and lateral segments. Lungs elsewhere are clear. No pleural effusion or pleural thickening. Upper Abdomen: There is slight reflux of contrast into the inferior vena cava. Visualized upper abdominal structures otherwise appear unremarkable. Musculoskeletal: There are no appreciable blastic or lytic bone lesions. No chest wall lesions are evident. Review of the MIP images confirms the above findings. IMPRESSION: 1. Lower lobe pulmonary emboli bilaterally involving several lower lobe pulmonary branches bilaterally. There is also embolus in a portion of the right inter lobar pulmonary artery, arising at the junction of the right main and right intralobar pulmonary arteries. No more central pulmonary emboli evident. No right heart strain demonstrable. 2. Opacification along the periphery of the left lower lobe involving portions of the anterior and lateral segments. This opacity is not wedge-shaped. Suspect pneumonia as more likely than pulmonary infarct. A degree of pulmonary infarct superimposed on pneumonia is certainly possible. There is bibasilar atelectasis posteriorly as well. 3.  No thoracic adenopathy. 4. Slight reflux of contrast into the inferior vena cava may indicate a degree of increase in right heart pressure. 5.  No thoracic aortic aneurysm or dissection. Critical Value/emergent results were called by telephone at the time of interpretation on 06/24/2018 at 2:34 pm to Dr. Pricilla LovelessSCOTT GOLDSTON , who verbally acknowledged these results. Electronically Signed   By: Bretta BangWilliam  Woodruff III M.D.   On: 06/24/2018 14:34    EKG: Personally reviewed by me which shows normal sinus rhythm, RSR pattern in V1 V2  Assessment/Plan Principal Problem:   Bilateral pulmonary embolism (HCC) Active  Problems:   Pulmonary infarct (HCC)  Bilateral pulmonary embolism.  Likely provoked but unclear .  Check venous duplex ultrasound of the lower extremity.  CT scan does not show any evidence of right heart strain but patient required IV narcotic for chest pain relief.  Does not have a primary care physician at this time.  Will monitor overnight for hemodynamic stability.  Focus on analgesia.  Provide oxygen as needed.  Will need primary care physician as outpatient.  I have spoken with the patient regarding the risk versus benefits of anticoagulation and he is okay with Eliquis for anticoagulation.  Precautions on bleeding were explained to the patient.  Opacification of the left lower lobe possibly pulmonary infarct.  No fever or signs of infection.  No  leukocytosis.  Avoid antibiotic at this time.  Consider antibiotics if patient develops fever or sputum production.  Consultant: None  Code Status: Full code  DVT Prophylaxis: Heparin drip followed by oral anticoagulation, Eliquis will be started today  Antibiotics: None  Family Communication:  Patients' condition and plan of care including tests being ordered have been discussed with the patient  who indicate understanding and agree with the plan.  Disposition Plan: Home likely tomorrow if he remains hemodynamically stable.  Will need primary care physician set up on discharge.  Severity of Illness: The appropriate patient status for this patient is OBSERVATION. Observation status is judged to be reasonable and necessary in order to provide the required intensity of service to ensure the patient's safety. The patient's presenting symptoms, physical exam findings, and initial radiographic and laboratory data in the context of their medical condition is felt to place them at decreased risk for further clinical deterioration. Furthermore, it is anticipated that the patient will be medically stable for discharge from the hospital within 2 midnights  of admission.   Signed, Joycelyn DasLaxman Dencil Cayson, MD [facility] 06/24/2018

## 2018-06-24 NOTE — ED Provider Notes (Signed)
[facility]-EMERGENCY DEPT Provider Note   CSN: [id number] Arrival date & time: 06/24/18  1142     History   Chief Complaint Chief Complaint  Patient presents with  . Shortness of Breath  . sent to rule out PE    HPI Stephen Farmer is a 38 y.o. male.  HPI  38 year old male presents with chest pain.  Started 2 days ago in his left mid axillary chest.  It is pleuritic.  Last night it was much more severe and he took some naproxen.  Today is about a 3 out of 10 and has not take anything for it yet.  He recent traveled to [place] and about a week or 2 ago developed some left leg pain and cramping.  No swelling.  Was sent here from [place] with concern for PE.  There is some shortness of breath but no cough or fever.  No urinary symptoms or backslash abdominal pain.  History reviewed. No pertinent past medical history.  Patient Active Problem List   Diagnosis Date Noted  . 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 Pulmonary embolism (HCC) 06/24/2018  . Chest pain 06/23/2018    History reviewed. No pertinent surgical history.      Home Medications    Prior to Admission medications   Medication Sig Start Date End Date Taking? Authorizing Provider  polyvinyl alcohol (LIQUIFILM TEARS) 1.4 % ophthalmic solution Place 1 drop into both eyes as needed for dry eyes.   Yes [provider]    Family History Family History  Problem Relation Age of Onset  . Hypertension Mother     Social History Social History   Tobacco Use  . Smoking status: Never Smoker  . Smokeless tobacco: Never Used  Substance Use Topics  . Alcohol use: Yes  . Drug use: No     Allergies   Patient has no known allergies.   Review of Systems Review of Systems  Respiratory: Positive for shortness of breath.   Cardiovascular: Positive for chest pain. Negative for leg swelling.  Gastrointestinal: Negative for  abdominal pain and vomiting.  Musculoskeletal: Positive for myalgias.  All other systems reviewed and are negative.    Physical Exam Updated Vital Signs BP 138/87 (BP Location: Left Arm)   Pulse 61   Temp 97.7 F (36.5 C) (Oral)   Resp (!) 24   SpO2 96%   Physical Exam Vitals signs and nursing note reviewed.  Constitutional:      General: He is not in acute distress.    Appearance: He is well-developed. He is not ill-appearing or diaphoretic.  HENT:     Head: Normocephalic and atraumatic.     Right Ear: External ear normal.     Left Ear: External ear normal.     Nose: Nose normal.  Eyes:     General:        Right eye: No discharge.        Left eye: No discharge.  Neck:     Musculoskeletal: Neck supple.  Cardiovascular:     Rate and Rhythm: Normal rate and regular rhythm.     Heart sounds: Normal heart sounds.  Pulmonary:     Effort: Pulmonary effort is normal.     Breath sounds: Normal breath sounds.  Chest:     Chest wall: No tenderness.  Abdominal:     Palpations: Abdomen is soft.     Tenderness: There is no abdominal  tenderness. There is no right CVA tenderness or left CVA tenderness.  Musculoskeletal:     Thoracic back: He exhibits no tenderness.     Lumbar back: He exhibits no tenderness.     Left lower leg: He exhibits tenderness (mild, calf. no swelling).  Skin:    General: Skin is warm and dry.  Neurological:     Mental Status: He is alert.  Psychiatric:        Mood and Affect: Mood is not anxious.      ED Treatments / Results  Labs (all labs ordered are listed, but only abnormal results are displayed) Labs Reviewed  D-DIMER, QUANTITATIVE (NOT AT [facility]) - Abnormal; Notable for the following components:      Result Value   D-Dimer, Quant 3.93 (*)    All other components within normal limits  BASIC METABOLIC PANEL  CBC  APTT  PROTIME-INR  I-STAT TROPONIN, ED    EKG EKG Interpretation  Date/Time:  Friday June 24 2018 11:52:55  EST Ventricular Rate:  67 PR Interval:    QRS Duration: 92 QT Interval:  379 QTC Calculation: 400 R Axis:   20 Text Interpretation:  Sinus rhythm RSR' in V1 or V2, probably normal variant Consider left ventricular hypertrophy ST elev, probable normal early repol pattern no reciprocal changes No old tracing to compare Confirmed by Pricilla LovelessGoldston, Tyreesha Maharaj [phone]([id number]) on 06/24/2018 12:53:53 PM   Radiology Dg Chest 2 View  Result Date: 06/23/2018 CLINICAL DATA:  Chest pain EXAM: CHEST - 2 VIEW COMPARISON:  None. FINDINGS: Lungs are clear. The heart size and pulmonary vascularity are normal. No adenopathy. No pneumothorax. No bone lesions. IMPRESSION: No edema or consolidation. Electronically Signed   By: Bretta BangWilliam  Woodruff III M.D.   On: 06/23/2018 13:02   Ct Angio Chest Pe W/cm &/or Wo Cm  Result Date: 06/24/2018 CLINICAL DATA:  Shortness of breath and chest pain EXAM: CT ANGIOGRAPHY CHEST WITH CONTRAST TECHNIQUE: Multidetector CT imaging of the chest was performed using the standard protocol during bolus administration of intravenous contrast. Multiplanar CT image reconstructions and MIPs were obtained to evaluate the vascular anatomy. CONTRAST:  88 mL ISOVUE-370 IOPAMIDOL (ISOVUE-370) INJECTION 76% COMPARISON:  None. FINDINGS: Cardiovascular: There are pulmonary emboli extending from the right intralobar pulmonary artery into several right lower lobe pulmonary artery branches. There are also multiple left lower lobe pulmonary artery branches containing pulmonary emboli. The right ventricle to left ventricle diameter ratio is less than 0.9, not consistent with right heart strain. No thoracic aortic aneurysm or dissection. Visualized great vessels appear unremarkable. No pericardial effusion or pericardial thickening evident. Mediastinum/Nodes: Thyroid appears normal. There is no appreciable thoracic adenopathy. No esophageal lesions are evident. Lungs/Pleura: There is mild bibasilar atelectatic change. There is  airspace consolidation along the periphery of the left lower lobe involving portions of the anterior and lateral segments. Lungs elsewhere are clear. No pleural effusion or pleural thickening. Upper Abdomen: There is slight reflux of contrast into the inferior vena cava. Visualized upper abdominal structures otherwise appear unremarkable. Musculoskeletal: There are no appreciable blastic or lytic bone lesions. No chest wall lesions are evident. Review of the MIP images confirms the above findings. IMPRESSION: 1. Lower lobe pulmonary emboli bilaterally involving several lower lobe pulmonary branches bilaterally. There is also embolus in a portion of the right inter lobar pulmonary artery, arising at the junction of the right main and right intralobar pulmonary arteries. No more central pulmonary emboli evident. No right heart strain demonstrable. 2. Opacification along the periphery  of the left lower lobe involving portions of the anterior and lateral segments. This opacity is not wedge-shaped. Suspect pneumonia as more likely than pulmonary infarct. A degree of pulmonary infarct superimposed on pneumonia is certainly possible. There is bibasilar atelectasis posteriorly as well. 3.  No thoracic adenopathy. 4. Slight reflux of contrast into the inferior vena cava may indicate a degree of increase in right heart pressure. 5.  No thoracic aortic aneurysm or dissection. Critical Value/emergent results were called by telephone at the time of interpretation on 06/24/2018 at 2:34 pm to Dr. Pricilla Loveless , who verbally acknowledged these results. Electronically Signed   By: Bretta Bang III M.D.   On: 06/24/2018 14:34   Vas [place] Lower Extremity Venous (dvt) (mc And Wl 7a-7p)  Result Date: 06/24/2018  Lower Venous Study Indications: Pulmonary embolism.  Performing Technologist: Chanda Busing RVT  Examination Guidelines: A complete evaluation includes B-mode imaging, spectral Doppler, color Doppler, and power Doppler  as needed of all accessible portions of each vessel. Bilateral testing is considered an integral part of a complete examination. Limited examinations for reoccurring indications may be performed as noted.  Right Venous Findings: +---+---------------+---------+-----------+----------+-------+    CompressibilityPhasicitySpontaneityPropertiesSummary +---+---------------+---------+-----------+----------+-------+ CFVFull           Yes      Yes                          +---+---------------+---------+-----------+----------+-------+  Left Venous Findings: +---------+---------------+---------+-----------+----------+-------+          CompressibilityPhasicitySpontaneityPropertiesSummary +---------+---------------+---------+-----------+----------+-------+ CFV      Full           Yes      Yes                          +---------+---------------+---------+-----------+----------+-------+ SFJ      Full                                                 +---------+---------------+---------+-----------+----------+-------+ FV Prox  Full                                                 +---------+---------------+---------+-----------+----------+-------+ FV Mid   Full                                                 +---------+---------------+---------+-----------+----------+-------+ FV DistalFull                                                 +---------+---------------+---------+-----------+----------+-------+ PFV      Full                                                 +---------+---------------+---------+-----------+----------+-------+ POP      Full  Yes      Yes                          +---------+---------------+---------+-----------+----------+-------+ PTV      None                                         Acute   +---------+---------------+---------+-----------+----------+-------+ PERO     Full                                                  +---------+---------------+---------+-----------+----------+-------+    Summary: Right: No evidence of common femoral vein obstruction. Left: Findings consistent with acute deep vein thrombosis involving the left posterior tibial vein. No cystic structure found in the popliteal fossa.  *See table(s) above for measurements and observations.    Preliminary     Procedures .Critical Care Performed by: Pricilla Loveless, MD Authorized by: Pricilla Loveless, MD   Critical care provider statement:    Critical care time (minutes):  30   Critical care time was exclusive of:  Separately billable procedures and treating other patients   Critical care was necessary to treat or prevent imminent or life-threatening deterioration of the following conditions:  Shock and respiratory failure   Critical care was time spent personally by me on the following activities:  Development of treatment plan with patient or surrogate, discussions with consultants, evaluation of patient's response to treatment, examination of patient, obtaining history from patient or surrogate, ordering and performing treatments and interventions, ordering and review of laboratory studies, ordering and review of radiographic studies, pulse oximetry, re-evaluation of patient's condition and review of old charts   (including critical care time)  Medications Ordered in ED Medications  sodium chloride (PF) 0.9 % injection (has no administration in time range)  iopamidol (ISOVUE-370) 76 % injection (has no administration in time range)  fentaNYL (SUBLIMAZE) injection 50 mcg (has no administration in time range)  heparin ADULT infusion 100 units/mL (25000 units/266mL sodium chloride 0.45%) (1,450 Units/hr Intravenous New Bag/Given 06/24/18 1548)  ibuprofen (ADVIL,MOTRIN) tablet 600 mg (600 mg Oral Given 06/24/18 1310)  sodium chloride 0.9 % bolus 1,000 mL (0 mLs Intravenous Stopped 06/24/18 1416)  iopamidol (ISOVUE-370) 76 % injection 100 mL (100 mLs  Intravenous Contrast Given 06/24/18 1414)  heparin bolus via infusion 5,000 Units (5,000 Units Intravenous Bolus from Bag 06/24/18 1547)     Initial Impression / Assessment and Plan / ED Course  I have reviewed the triage vital signs and the nursing notes.  Pertinent labs & imaging results that were available during my care of the patient were reviewed by me and considered in my medical decision making (see chart for details).     Patient CT scan confirms pulmonary embolism.  DVT ultrasound also shows a left calf DVT.  He is not in distress or hypoxic though he does have a little bit of increased work of breathing.  CT scan shows possible pneumonia but given the setting I think this is much more likely to be infarct, especially with no cough.  Thus I will place him on IV heparin and discussed with hospitalist who will admit.  Final Clinical Impressions(s) / ED Diagnoses   Final diagnoses:  Pulmonary embolism with  infarction [facility])    ED Discharge Orders    None       Pricilla Loveless, MD 06/24/18 [id number]

## 2018-06-25 ENCOUNTER — Observation Stay (HOSPITAL_BASED_OUTPATIENT_CLINIC_OR_DEPARTMENT_OTHER): Payer: BLUE CROSS/BLUE SHIELD

## 2018-06-25 DIAGNOSIS — I2699 Other pulmonary embolism without acute cor pulmonale: Secondary | ICD-10-CM | POA: Diagnosis not present

## 2018-06-25 LAB — BASIC METABOLIC PANEL
Anion gap: 11 (ref 5–15)
BUN: 13 mg/dL (ref 6–20)
CALCIUM: 8.5 mg/dL — AB (ref 8.9–10.3)
CO2: 23 mmol/L (ref 22–32)
Chloride: 100 mmol/L (ref 98–111)
Creatinine, Ser: 1.21 mg/dL (ref 0.61–1.24)
GFR calc Af Amer: >60 mL/min (ref >60)
GFR calc non Af Amer: >60 mL/min (ref >60)
Glucose, Bld: 99 mg/dL (ref 70–99)
Potassium: 3.7 mmol/L (ref 3.5–5.1)
Sodium: 134 mmol/L — ABNORMAL LOW (ref 135–145)

## 2018-06-25 LAB — HIV ANTIBODY (ROUTINE TESTING W REFLEX): HIV Screen 4th Generation wRfx: NONREACTIVE

## 2018-06-25 LAB — CBC
HCT: 43.5 % (ref 39.0–52.0)
Hemoglobin: 14.5 g/dL (ref 13.0–17.0)
MCH: 32.4 pg (ref 26.0–34.0)
MCHC: 33.3 g/dL (ref 30.0–36.0)
MCV: 97.3 fL (ref 80.0–100.0)
PLATELETS: 193 10*3/uL (ref 150–400)
RBC: 4.47 MIL/uL (ref 4.22–5.81)
RDW: 12.8 % (ref 11.5–15.5)
WBC: 6 10*3/uL (ref 4.0–10.5)
nRBC: 0 % (ref 0.0–0.2)

## 2018-06-25 LAB — ECHOCARDIOGRAM COMPLETE
Height: 68 in
Weight: 3041.6 oz

## 2018-06-25 MED ORDER — ELIQUIS 5 MG VTE STARTER PACK: ORAL_TABLET | ORAL | 0 refills | Status: DC

## 2018-06-25 MED ORDER — HYDROCODONE-ACETAMINOPHEN 5-325 MG PO TABS: 1.0000 | ORAL_TABLET | Freq: Four times a day (QID) | ORAL | 0 refills | Status: DC | PRN

## 2018-06-25 NOTE — Progress Notes (Signed)
  Echocardiogram 2D Echocardiogram has been performed.  Mitali Shenefield L Androw 06/25/2018, 12:22 PM

## 2018-06-25 NOTE — Discharge Instructions (Signed)
Find a Primary Care Physician and follow up as soon as possible in 1-2 weeks.   Information on my medicine - ELIQUIS (apixaban)  This medication education was reviewed with me or my healthcare representative as part of my discharge preparation.  The pharmacist that spoke with me during my hospital stay was:  Valentina Gu [facility]  Why was Eliquis prescribed for you? Eliquis was prescribed to treat blood clots that may have been found in the veins of your legs (deep vein thrombosis) or in your lungs (pulmonary embolism) and to reduce the risk of them occurring again.  What do You need to know about Eliquis ? The starting dose is 10 mg (two 5 mg tablets) taken TWICE daily for the FIRST SEVEN (7) DAYS, then on (enter date)  07/01/18 (the PM dose) the dose is reduced to ONE 5 mg tablet taken TWICE daily.  Eliquis may be taken with or without food.   Try to take the dose about the same time in the morning and in the evening. If you have difficulty swallowing the tablet whole please discuss with your pharmacist how to take the medication safely.  Take Eliquis exactly as prescribed and DO NOT stop taking Eliquis without talking to the doctor who prescribed the medication.  Stopping may increase your risk of developing a new blood clot.  Refill your prescription before you run out.  After discharge, you should have regular check-up appointments with your healthcare provider that is prescribing your Eliquis.    What do you do if you miss a dose? If a dose of ELIQUIS is not taken at the scheduled time, take it as soon as possible on the same day and twice-daily administration should be resumed. The dose should not be doubled to make up for a missed dose.  Important Safety Information A possible side effect of Eliquis is bleeding. You should call your healthcare provider right away if you experience any of the following: ? Bleeding from an injury or your nose that does not stop. ? Unusual  colored urine (red or dark brown) or unusual colored stools (red or black). ? Unusual bruising for unknown reasons. ? A serious fall or if you hit your head (even if there is no bleeding).  Some medicines may interact with Eliquis and might increase your risk of bleeding or clotting while on Eliquis. To help avoid this, consult your healthcare provider or pharmacist prior to using any new prescription or non-prescription medications, including herbals, vitamins, non-steroidal anti-inflammatory drugs (NSAIDs) and supplements.  This website has more information on Eliquis (apixaban): http://www.eliquis.com/eliquis/home

## 2018-06-25 NOTE — Care Management Note (Signed)
Case Management Note  Patient Details  Name: Stephen Farmer MRN: [id number] Date of Birth: [birth year]  Subjective/Objective:                    Action/Plan:Spoke with pt and mother at bedside concerning PCP and  Eliquis coupon. Pt may call insurance for PCP or call [facility]  For an appointment.    Expected Discharge Date:  06/25/18               Expected Discharge Plan:  Home/Self Care  In-House Referral:     Discharge planning Services  CM Consult  Post Acute Care Choice:    Choice offered to:  Patient, Parent  DME Arranged:    DME Agency:     HH Arranged:    HH Agency:     Status of Service:  Completed, signed off  If discussed at Long Length of Stay Meetings, dates discussed:    Additional CommentsGeni Bers, RN 06/25/2018, 12:40 PM

## 2018-06-25 NOTE — Progress Notes (Signed)
Patient is stable for discharge. Discharge instructions and medications have been reviewed with the patient and all questions answered. AVS and prescriptions given to patient. Patient given Eliquis savings cards provided by pharmacy.   Stephen Farmer, [name] 06/25/2018

## 2018-06-25 NOTE — Discharge Summary (Signed)
Physician Discharge Summary  Stephen Farmer XTK:[id number] DOB: 1980-08-10 DOA: 06/24/2018  PCP: Patient, No Pcp Per  Admit date: 06/24/2018 Discharge date: 06/25/2018  Admitted From:home Disposition:home Recommendations for Outpatient Follow-up:  1. Follow up with PCP in 1-2 weeks 2. Please obtain BMP/CBC in one week 3. Follow-up with a hematologist please ask your PCP to refer you to a hematologist  Home Health: None Equipment/Devices none  Discharge Condition stable CODE STATUS: Full code Diet recommendation: Cardiac Brief/Interim Summary:38 y.o. male with no significant past medical history presented to the hospital with complaints of chest pain.  Patient had initially gone to urgent care for the same and was sent to our hospital with suspicion for PE.  He reported left-sided constant chest pain which was sharp in nature for the last 2 days which was around 3/ 10 in intensity.  Had recently traveled to [place] a week back and before flew to [place] he was having some discomfort in his leg but it did not bother him much so did not stop his activity.  Patient denied any fever, chills or rigor.  He had mild shortness of breath yesterday with some diaphoresis and feeling of being feverish.  Noticed chest pain especially on taking a deep breath.  Patient denies any hemoptysis or cough with sputum production.  Patient denied any urinary urgency, frequency or dysuria.  Denies any sick contacts.  Denies any syncope or loss of consciousness.  Denies any dizziness or lightheadedness.  Has any nausea, vomiting or abdominal pain.  No mention of constipation or diarrhea.  No prior history of DVT or pulmonary embolism.  No family history of DVT  ED Course: In the ED, patient had a CT scan of the chest which showed evidence of bilateral lower lobe pulmonary emboli with embolus in the portion of the right interlobar pulmonary artery arising at the junction of the right main.  There was no right heart  strain demonstrated.  There was some opacification over the periphery of the left lower lobe as well so the patient was considered for admission to the hospital for further evaluation and observation.  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  Venous Doppler of the lower extremities showed left lower extremity acute DVT.  Patient is not sedentary is very athletic in nature.  He played basketball the other day and had hurt his leg he did not fall nor he did not have any significant injury that he needed to go to the hospital he thought he pulled his muscle and it will get better.  CT scan does not show any evidence of right heart strain but patient required IV narcotic for chest pain relief.  Does not have a primary care physician at this time.  I have discussed with the patient and his mother that he needs to find a primary care physician.  Saw him he was on room air oxygen saturation above 92%.  Patient was started on Eliquis and will continue on Eliquis.Opacification of the left lower lobe possibly pulmonary infarct.  No fever or signs of infection.  No  leukocytosis.  Patient is a non-smoker.   Estimated body mass index is 28.9 kg/m as calculated from the following:   Height as of this encounter: 5\' 8"  (1.727 m).   Weight as  of this encounter: 86.2 kg.  Discharge Instructions  Discharge Instructions    Call MD for:  difficulty breathing, headache or visual disturbances   Complete by:  As directed    Call MD for:  temperature >100.4   Complete by:  As directed    Diet - low sodium heart healthy   Complete by:  As directed    Increase activity slowly   Complete by:  As directed      Allergies as of 06/25/2018   No Known Allergies     Medication List     TAKE these medications   ELIQUIS STARTER PACK 5 MG Tabs Take as directed on package: start with two-5mg  tablets twice daily for 7 days. On day 8, switch to one-5mg  tablet twice daily.   polyvinyl alcohol 1.4 % ophthalmic solution Commonly known as:  LIQUIFILM TEARS Place 1 drop into both eyes as needed for dry eyes.       No Known Allergies  Consultations:  None   Procedures/Studies: Dg Chest 2 View  Result Date: 06/23/2018 CLINICAL DATA:  Chest pain EXAM: CHEST - 2 VIEW COMPARISON:  None. FINDINGS: Lungs are clear. The heart size and pulmonary vascularity are normal. No adenopathy. No pneumothorax. No bone lesions. IMPRESSION: No edema or consolidation. Electronically Signed   By: Bretta Bang III M.D.   On: 06/23/2018 13:02   Ct Angio Chest Pe W/cm &/or Wo Cm  Result Date: 06/24/2018 CLINICAL DATA:  Shortness of breath and chest pain EXAM: CT ANGIOGRAPHY CHEST WITH CONTRAST TECHNIQUE: Multidetector CT imaging of the chest was performed using the standard protocol during bolus administration of intravenous contrast. Multiplanar CT image reconstructions and MIPs were obtained to evaluate the vascular anatomy. CONTRAST:  88 mL ISOVUE-370 IOPAMIDOL (ISOVUE-370) INJECTION 76% COMPARISON:  None. FINDINGS: Cardiovascular: There are pulmonary emboli extending from the right intralobar pulmonary artery into several right lower lobe pulmonary artery branches. There are also multiple left lower lobe pulmonary artery branches containing pulmonary emboli. The right ventricle to left ventricle diameter ratio is less than 0.9, not consistent with right heart strain. No thoracic aortic aneurysm or dissection. Visualized great vessels appear unremarkable. No pericardial effusion or pericardial thickening evident. Mediastinum/Nodes: Thyroid appears normal. There is no appreciable thoracic adenopathy. No esophageal lesions are evident. Lungs/Pleura: There is mild bibasilar atelectatic change. There is  airspace consolidation along the periphery of the left lower lobe involving portions of the anterior and lateral segments. Lungs elsewhere are clear. No pleural effusion or pleural thickening. Upper Abdomen: There is slight reflux of contrast into the inferior vena cava. Visualized upper abdominal structures otherwise appear unremarkable. Musculoskeletal: There are no appreciable blastic or lytic bone lesions. No chest wall lesions are evident. Review of the MIP images confirms the above findings. IMPRESSION: 1. Lower lobe pulmonary emboli bilaterally involving several lower lobe pulmonary branches bilaterally. There is also embolus in a portion of the right inter lobar pulmonary artery, arising at the junction of the right main and right intralobar pulmonary arteries. No more central pulmonary emboli evident. No right heart strain demonstrable. 2. Opacification along the periphery of the left lower lobe involving portions of the anterior and lateral segments. This opacity is not wedge-shaped. Suspect pneumonia as more likely than pulmonary infarct. A degree of pulmonary infarct superimposed on pneumonia is certainly possible. There is bibasilar atelectasis posteriorly as well. 3.  No thoracic adenopathy. 4. Slight reflux of contrast into the inferior vena cava may indicate a degree of increase in right  heart pressure. 5.  No thoracic aortic aneurysm or dissection. Critical Value/emergent results were called by telephone at the time of interpretation on 06/24/2018 at 2:34 pm to Dr. Pricilla LovelessSCOTT GOLDSTON , who verbally acknowledged these results. Electronically Signed   By: Bretta BangWilliam  Woodruff III M.D.   On: 06/24/2018 14:34   Vas [place] Lower Extremity Venous (dvt) (mc And Wl 7a-7p)  Result Date: 06/24/2018  Lower Venous Study Indications: Pulmonary embolism.  Performing Technologist: Chanda BusingGregory Collins RVT  Examination Guidelines: A complete evaluation includes B-mode imaging, spectral Doppler, color Doppler, and power Doppler  as needed of all accessible portions of each vessel. Bilateral testing is considered an integral part of a complete examination. Limited examinations for reoccurring indications may be performed as noted.  Right Venous Findings: +---+---------------+---------+-----------+----------+-------+    CompressibilityPhasicitySpontaneityPropertiesSummary +---+---------------+---------+-----------+----------+-------+ CFVFull           Yes      Yes                          +---+---------------+---------+-----------+----------+-------+  Left Venous Findings: +---------+---------------+---------+-----------+----------+-------+          CompressibilityPhasicitySpontaneityPropertiesSummary +---------+---------------+---------+-----------+----------+-------+ CFV      Full           Yes      Yes                          +---------+---------------+---------+-----------+----------+-------+ SFJ      Full                                                 +---------+---------------+---------+-----------+----------+-------+ FV Prox  Full                                                 +---------+---------------+---------+-----------+----------+-------+ FV Mid   Full                                                 +---------+---------------+---------+-----------+----------+-------+ FV DistalFull                                                 +---------+---------------+---------+-----------+----------+-------+ PFV      Full                                                 +---------+---------------+---------+-----------+----------+-------+ POP      Full           Yes      Yes                          +---------+---------------+---------+-----------+----------+-------+ PTV      None  Acute   +---------+---------------+---------+-----------+----------+-------+ PERO     Full                                                  +---------+---------------+---------+-----------+----------+-------+    Summary: Right: No evidence of common femoral vein obstruction. Left: Findings consistent with acute deep vein thrombosis involving the left posterior tibial vein. No cystic structure found in the popliteal fossa.  *See table(s) above for measurements and observations.    Preliminary     (Echo, Carotid, EGD, Colonoscopy, ERCP)    Subjective:   Discharge Exam: Vitals:   06/25/18 0524 06/25/18 0652  BP: 128/77   Pulse: 82   Resp: 18   Temp: (!) 103 F (39.4 C) 99.4 F (37.4 C)  SpO2: 97%    Vitals:   06/24/18 2010 06/24/18 2010 06/25/18 0524 06/25/18 0652  BP:  (!) 141/81 128/77   Pulse:  60 82   Resp:  16 18   Temp:  98.5 F (36.9 C) (!) 103 F (39.4 C) 99.4 F (37.4 C)  TempSrc:  Oral Oral Oral  SpO2:  100% 97%   Weight: 86.2 kg     Height: 5\' 8"  (1.727 m)       General: Pt is alert, awake, not in acute distress Cardiovascular: RRR, S1/S2 +, no rubs, no gallops Respiratory: CTA bilaterally, no wheezing, no rhonchi Abdominal: Soft, NT, ND, bowel sounds + Extremities: no edema, no cyanosis    The results of significant diagnostics from this hospitalization (including imaging, microbiology, ancillary and laboratory) are listed below for reference.     Microbiology: No results found for this or any previous visit (from the past 240 hour(s)).   Labs: BNP (last 3 results) No results for input(s): BNP in the last 8760 hours. Basic Metabolic Panel: Recent Labs  Lab 06/24/18 1219 06/25/18 0517  NA 138 134*  K 3.8 3.7  CL 99 100  CO2 28 23  GLUCOSE 93 99  BUN 14 13  CREATININE 1.21 1.21  CALCIUM 9.1 8.5*   Liver Function Tests: No results for input(s): AST, ALT, ALKPHOS, BILITOT, PROT, ALBUMIN in the last 168 hours. No results for input(s): LIPASE, AMYLASE in the last 168 hours. No results for input(s): AMMONIA in the last 168 hours. CBC: Recent Labs  Lab 06/24/18 1219 06/25/18 0517   WBC 5.8 6.0  HGB 16.0 14.5  HCT 48.1 43.5  MCV 98.0 97.3  PLT 218 193   Cardiac Enzymes: No results for input(s): CKTOTAL, CKMB, CKMBINDEX, TROPONINI in the last 168 hours. BNP: Invalid input(s): POCBNP CBG: No results for input(s): GLUCAP in the last 168 hours. D-Dimer Recent Labs    06/24/18 1219  DDIMER 3.93*   Hgb A1c No results for input(s): HGBA1C in the last 72 hours. Lipid Profile No results for input(s): CHOL, HDL, LDLCALC, TRIG, CHOLHDL, LDLDIRECT in the last 72 hours. Thyroid function studies No results for input(s): TSH, T4TOTAL, T3FREE, THYROIDAB in the last 72 hours.  Invalid input(s): FREET3 Anemia work up No results for input(s): VITAMINB12, FOLATE, FERRITIN, TIBC, IRON, RETICCTPCT in the last 72 hours. Urinalysis    Component Value Date/Time   BILIRUBINUR small 01/08/2013 1026   PROTEINUR neg 01/08/2013 1026   UROBILINOGEN 2.0 01/08/2013 1026   NITRITE neg 01/08/2013 1026   LEUKOCYTESUR Negative 01/08/2013 1026   Sepsis Labs Invalid input(s): PROCALCITONIN,  WBC,  LACTICIDVEN Microbiology No results found for this or any previous visit (from the past 240 hour(s)).   Time coordinating discharge: 33 minutes  SIGNED:   Alwyn Ren, MD  [facility] 06/25/2018, 11:47 AM Pager   If 7PM-7AM, please contact night-coverage [email or web address] Password [email or web address]

## 2018-07-15 ENCOUNTER — Encounter: Payer: Self-pay | Admitting: Family Medicine

## 2018-07-15 ENCOUNTER — Encounter

## 2018-07-15 ENCOUNTER — Ambulatory Visit (INDEPENDENT_AMBULATORY_CARE_PROVIDER_SITE_OTHER): Payer: BLUE CROSS/BLUE SHIELD | Admitting: Family Medicine

## 2018-07-15 ENCOUNTER — Inpatient Hospital Stay: Payer: BLUE CROSS/BLUE SHIELD | Admitting: Family Medicine

## 2018-07-15 VITALS — BP 123/82 | HR 58 | Resp 17 | Ht 68.0 in | Wt 186.2 lb

## 2018-07-15 DIAGNOSIS — R0602 Shortness of breath: Secondary | ICD-10-CM | POA: Diagnosis not present

## 2018-07-15 DIAGNOSIS — Z7689 Persons encountering health services in other specified circumstances: Secondary | ICD-10-CM

## 2018-07-15 DIAGNOSIS — I82402 Acute embolism and thrombosis of unspecified deep veins of left lower extremity: Secondary | ICD-10-CM | POA: Diagnosis not present

## 2018-07-15 DIAGNOSIS — Z7901 Long term (current) use of anticoagulants: Secondary | ICD-10-CM

## 2018-07-15 DIAGNOSIS — I2699 Other pulmonary embolism without acute cor pulmonale: Secondary | ICD-10-CM

## 2018-07-15 MED ORDER — APIXABAN 5 MG PO TABS: 5.0000 mg | ORAL_TABLET | Freq: Two times a day (BID) | ORAL | 3 refills | Status: DC

## 2018-07-15 MED ORDER — ACETAMINOPHEN-CODEINE #3 300-30 MG PO TABS: 1.0000 | ORAL_TABLET | ORAL | 0 refills | Status: DC | PRN

## 2018-07-15 NOTE — Patient Instructions (Addendum)
[facility]    Stephen Farmer was seen by Joaquin CourtsKimberly Harris, FNP today.   Stephen Farmer's primary care provider is Bing NeighborsHarris, Kimberly S, FNP.   For the best care possible, you should try to see Joaquin CourtsKimberly Harris, FNP-C whenever you come to the clinic.   We look forward to seeing you again soon!  If you have any questions about your visit today, please call us at [phone] or feel free to reach your primary care provider via MyChart.      Pulmonary Embolism  A pulmonary embolism (PE) is a sudden blockage or decrease of blood flow in one lung or both lungs. Most blockages come from a blood clot that forms in a lower leg, thigh, or arm vein (deep vein thrombosis, DVT) and travels to the lungs. A clot is blood that has thickened into a gel or solid. PE is a dangerous and life-threatening condition that needs to be treated right away. What are the causes? This condition is usually caused by a blood clot that forms in a vein and moves to the lungs. In rare cases, it may be caused by air, fat, part of a tumor, or other tissue that moves through the veins and into the lungs. What increases the risk? The following factors may make you more likely to develop this condition:  Traumatic injury, such as breaking a hip or leg.  Spinal cord injury.  Orthopedic surgery, especially hip or knee replacement.  Any major surgery.  Stroke.  Having DVT.  Blood clots or blood clotting disease.  Long-term (chronic) lung or heart disease.  Taking medicines that contain estrogen. These include birth control pills and hormone replacement therapy.  Cancer and chemotherapy.  Having a central venous catheter.  Pregnancy and the period of time after delivery (postpartum).  Being older than age 38.  Being overweight.  Smoking. What are the signs or symptoms? Symptoms of this condition usually start suddenly and include:  Shortness  of breath during activity or at rest.  Coughing or coughing up blood or blood-tinged mucus.  Chest pain that is often worse with deep breaths.  Rapid or irregular heartbeat.  Feeling light-headed or dizzy.  Fainting.  Feeling anxious.  Fever.  Sweating.  Pain and swelling in a leg. This is a symptom of DVT, which can lead to PE. How is this diagnosed? This condition may be diagnosed based on:  Your medical history.  A physical exam.  Blood tests.  CT pulmonary angiogram. This test checks blood flow in and around your lungs.  Ventilation-perfusion scan, also called a lung VQ scan. This test measures air flow and blood flow to the lungs.  Ultrasound of the legs. How is this treated? Treatment for this condition depends on many factors, such as the cause of your PE, your risk for bleeding or developing more clots, and other medical conditions you have. Treatment aims to remove, dissolve, or stop blood clots from forming or growing larger. Treatment may include:  Medicines, such as: ? Blood thinning medicines (anticoagulants) to stop clots from forming or growing. ? Medicines that dissolve clots (thrombolytics).  Procedures, such as: ? Using a flexible tube to remove a blood clot (embolectomy) or deliver medicine to destroy it (catheter-directed thrombolysis). ? Inserting a filter into a large vein that carries blood to the heart (inferior vena cava). This filter (vena cava filter) catches blood clots before they reach the lungs. ?  Surgery to remove the clot (surgical embolectomy). This is rare. You may need a combination of immediate, long-term (up to 3 months after diagnosis), and extended (more than 3 months after diagnosis) treatments. Your treatment may continue for several months (maintenance therapy). You and your health care provider will work together to choose the treatment program that is best for you. Follow these instructions at home: Medicines  Take  over-the-counter and prescription medicines only as told by your health care provider.  If you are taking an anticoagulant medicine: ? Take the medicine every day at the same time each day. ? Understand what foods and drugs interact with your medicine. ? Understand the side effects of this medicine, including excessive bruising or bleeding. Ask your health care provider or pharmacist about other side effects. General instructions  Wear a medical alert bracelet or carry a medical alert card that says you have had a PE and lists what medicines you take.  Ask your health care provider when you may return to your normal activities. Avoid sitting or lying for a long time without moving.  Maintain a healthy weight. Ask your health care provider what weight is healthy for you.  Do not use any products that contain nicotine or tobacco, such as cigarettes and e-cigarettes. If you need help quitting, ask your health care provider.  Talk with your health care provider about any travel plans. It is important to make sure that you are still able to take your medicine while on trips.  Keep all follow-up visits as told by your health care provider. This is important. Contact a health care provider if:  You missed a dose of your blood thinner medicine. Get help right away if:  You have: ? New or increased pain, swelling, warmth, or redness in an arm or leg. ? Numbness or tingling in an arm or leg. ? Shortness of breath during activity or at rest. ? A fever. ? Chest pain. ? A rapid or irregular heartbeat. ? A severe headache. ? Vision changes. ? A serious fall or accident, or you hit your head. ? Stomach (abdominal) pain. ? Blood in your vomit, stool, or urine. ? A cut that will not stop bleeding.  You cough up blood.  You feel light-headed or dizzy.  You cannot move your arms or legs.  You are confused or have memory loss. These symptoms may represent a serious problem that is an  emergency. Do not wait to see if the symptoms will go away. Get medical help right away. Call your local emergency services (911 in the [place] Do not drive yourself to the hospital. Summary  A pulmonary embolism (PE) is a sudden blockage or decrease of blood flow in one lung or both lungs. PE is a dangerous and life-threatening condition that needs to be treated right away.  Treatments for this condition usually include medicines to thin your blood (anticoagulants) or medicines to break apart blood clots (thrombolytics).  If you are given blood thinners, it is important to take the medicine every single day at the same time each day.  If you have signs of PE or DVT, call your local emergency services (911 in the [place] This information is not intended to replace advice given to you by your health care provider. Make sure you discuss any questions you have with your health care provider. Document Released: 05/22/2000 Document Revised: 01/07/2018 Document Reviewed: 07/08/2017 Elsevier Interactive Patient Education  2019 [employer]    Venous Thromboembolism Prevention Venous  thromboembolism (VTE) is a condition in which a blood clot (thrombus) develops in the body. A thrombus usually occurs in a deep vein in the leg or the pelvis (DVT), but it can also occur in the arm. Sometimes, pieces of a thrombus can break off from its original place of development and travel through the bloodstream to other parts of the body. When that happens, the thrombus is called an embolus. An embolus that travels to one or both lungs is called a pulmonary embolism. An embolism can block the blood flow in the blood vessels of other organs as well. VTE is a serious health condition that can cause disability or death. It is very important to get help right away and to not ignore symptoms. How can a VTE be prevented?   Exercise regularly. Take a brisk 30 minute walk every day. Staying active and moving around can help  you to prevent blood clots.  Avoid sitting or lying in bed for long periods of time without moving your legs. Change your position often, especially during long-distance travel (over 4 hours).  If you are a woman who is over 44 years of age, avoid unnecessary use of medicines that contain estrogen. These include birth control pills and hormone replacement therapy.  Do not smoke, especially if you take estrogen medicines. If you need help quitting, ask your health care provider.  Eat plenty of fruits and vegetables. Ask your health care provider or dietitian if there are foods that you should avoid.  Maintain a weight that is appropriate for your height. Ask your health care provider what weight is healthy for you.  Wear loose-fitting clothing. Avoid constrictive or tight clothing around your legs or waist.  Try not to bump or injure your legs. Avoid crossing your legs when you are sitting.  Do not use pillows under your knees while lying down unless told by your health care provider.  Wear support hose (compression stockings or TED hose) as told by your health care provider. Compression stockings increase blood flow in your legs and can help prevent blood clots. Do not let them bunch up when you are wearing them. How can I prevent VTE when I travel? Long-distance travel (over 4 hours) can increase the risk of a VTE. To prevent VTE when traveling:  Exercise your legs every hour by standing, stretching, and bending and straightening your legs. If you are traveling by airplane, train, or bus, walk up and down the aisle as often as possible to get your blood moving. If you are traveling by car, stop and get out of the car every hour to exercise your legs and stretch. Other types of exercise might include: ? Keeping your feet flat on the ground and raising your toes. ? Switching from tightening the muscles in your calves and thighs to relaxing those same muscles while you are sitting. ? Pointing  and flexing your feet at the ankle joints while you are sitting.  Stay well hydrated while traveling. Drink enough water to keep your urine clear or pale yellow.  Avoid drinking alcohol during long travel. Generally, it is not recommended that you take medicines to prevent DVT during routine travel. How can VTE be prevented if I am hospitalized? A VTE may be prevented by taking medicines that are prescribed to prevent blood clots (anticoagulants). You can also help to prevent VTE while in the hospital by taking these actions:  Get out of bed and walk. Ask your health care provider if this  is safe for you to do.  Request the use of a sequential compression device (SCD). This is a machine that pumps air into compression sleeves that are wrapped around your legs.  Request the use of compression stockings, which are tight, elastic stockings that apply pressure to the lower legs. Compression stockings are sometimes used with SCDs. How can I prevent VTE after surgery? Understand that there is an increased risk for VTE for the first 4-6 weeks after surgery. During this time:  Avoid long-distance travel (over 4 hours). If you must travel during this time, ask your health care provider about additional preventive actions that you can take. These might include exercising your arms and legs every hour while you travel.  Avoid sitting or lying still for too long. If possible, get up and walk around one time every hour. Ask your health care provider when this is safe for you to do. Get help right away if:  You have new or increased pain, swelling, or redness in an arm or leg.  You have numbness or tingling in an arm or leg.  You have shortness of breath while active or at rest.  You have chest pain.  You have a rapid or irregular heartbeat.  You feel light-headed or dizzy.  You cough up blood.  You notice blood in your vomit, bowel movement, or urine. These symptoms may represent a serious  problem that is an emergency. Do not wait to see if the symptoms will go away. Get medical help right away. Call your local emergency services (911 in the [place] Do not drive yourself to the hospital. This information is not intended to replace advice given to you by your health care provider. Make sure you discuss any questions you have with your health care provider. Document Released: 05/13/2009 Document Revised: 01/08/2017 Document Reviewed: 09/19/2014 Elsevier Interactive Patient Education  2019 [employer]         Apixaban oral tablets What is this medicine? APIXABAN (a PIX a ban) is an anticoagulant (blood thinner). It is used to lower the chance of stroke in people with a medical condition called atrial fibrillation. It is also used to treat or prevent blood clots in the lungs or in the veins. This medicine may be used for other purposes; ask your health care provider or pharmacist if you have questions. COMMON BRAND NAME(S): Eliquis What should I tell my health care provider before I take this medicine? They need to know if you have any of these conditions: -bleeding disorders -bleeding in the brain -blood in your stools (black or tarry stools) or if you have blood in your vomit -history of stomach bleeding -kidney disease -liver disease -mechanical heart valve -an unusual or allergic reaction to apixaban, other medicines, foods, dyes, or preservatives -pregnant or trying to get pregnant -breast-feeding How should I use this medicine? Take this medicine by mouth with a glass of water. Follow the directions on the prescription label. You can take it with or without food. If it upsets your stomach, take it with food. Take your medicine at regular intervals. Do not take it more often than directed. Do not stop taking except on your doctor's advice. Stopping this medicine may increase your risk of a blood clot. Be sure to refill your prescription before you run out of  medicine. Talk to your pediatrician regarding the use of this medicine in children. Special care may be needed. Overdosage: If you think you have taken too much of this medicine  contact a poison control center or emergency room at once. NOTE: This medicine is only for you. Do not share this medicine with others. What if I miss a dose? If you miss a dose, take it as soon as you can. If it is almost time for your next dose, take only that dose. Do not take double or extra doses. What may interact with this medicine? This medicine may interact with the following: -aspirin and aspirin-like medicines -certain medicines for fungal infections like ketoconazole and itraconazole -certain medicines for seizures like carbamazepine and phenytoin -certain medicines that treat or prevent blood clots like warfarin, enoxaparin, and dalteparin -clarithromycin -NSAIDs, medicines for pain and inflammation, like ibuprofen or naproxen -rifampin -ritonavir -St. John's wort This list may not describe all possible interactions. Give your health care provider a list of all the medicines, herbs, non-prescription drugs, or dietary supplements you use. Also tell them if you smoke, drink alcohol, or use illegal drugs. Some items may interact with your medicine. What should I watch for while using this medicine? Visit your healthcare professional for regular checks on your progress. You may need blood work done while you are taking this medicine. Your condition will be monitored carefully while you are receiving this medicine. It is important not to miss any appointments. Avoid sports and activities that might cause injury while you are using this medicine. Severe falls or injuries can cause unseen bleeding. Be careful when using sharp tools or knives. Consider using an Neurosurgeonelectric razor. Take special care brushing or flossing your teeth. Report any injuries, bruising, or red spots on the skin to your healthcare  professional. If you are going to need surgery or other procedure, tell your healthcare professional that you are taking this medicine. Wear a medical ID bracelet or chain. Carry a card that describes your disease and details of your medicine and dosage times. What side effects may I notice from receiving this medicine? Side effects that you should report to your doctor or health care professional as soon as possible: -allergic reactions like skin rash, itching or hives, swelling of the face, lips, or tongue -signs and symptoms of bleeding such as bloody or black, tarry stools; red or dark-brown urine; spitting up blood or brown material that looks like coffee grounds; red spots on the skin; unusual bruising or bleeding from the eye, gums, or nose -signs and symptoms of a blood clot such as chest pain; shortness of breath; pain, swelling, or warmth in the leg -signs and symptoms of a stroke such as changes in vision; confusion; trouble speaking or understanding; severe headaches; sudden numbness or weakness of the face, arm or leg; trouble walking; dizziness; loss of coordination This list may not describe all possible side effects. Call your doctor for medical advice about side effects. You may report side effects to FDA at 1-800-FDA-1088. Where should I keep my medicine? Keep out of the reach of children. Store at room temperature between 20 and 25 degrees C (68 and 77 degrees F). Throw away any unused medicine after the expiration date. NOTE: This sheet is a summary. It may not cover all possible information. If you have questions about this medicine, talk to your doctor, pharmacist, or health care provider.  2019 Elsevier/[employer] (2017-05-20 11:20:07)

## 2018-07-15 NOTE — Progress Notes (Signed)
Stephen Farmer, is a 38 y.o. male  WUJ:[id number]:[id number]  NFA:[id number]  DOB - 02/26/1981  CC:  Chief Complaint  Patient presents with  . Establish Care  . Hospitalization Follow-up    ED->Hosp 1/17-1/18: B pulmonary embolism. still has some SHOB. no abnormal bleeding       HPI: Stephen Farmer is a 38 y.o. male is here today to establish care and hospital follow-up.  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   Stephen Hansenlvin Wisecup presented to his PCP office on 06/23/18 with a complaint of chest pain and left leg pain then subsequently developed SOB and represented to PCP office on 06/24/18. PCP directed patient to the ER to rule out PE. [facility] and CTA revealed presence of bilateral PE's and possible pulmonary infarct vs pneumonia.  Doppler studies of were significant for acute deep vein thrombosis involving the left posterior tibial vein. He remained inpatient overnight and was started on Eliquis and subsequently discharged on 06/25/2018.  Patient attributes DVT to a hamstring injury he sustained a month and a half ago which he experienced significant left leg pain and never followed up for further evaluation.  He subsequently [place] before experiencing the chest pain and shortness of breath.  He admits to being able during the flight and sleeping most of the time. He has no history of prior hypercoagulability.  He denies any known family history of chronic clotting disorders or family members who have suffered from a stroke.  He is a non-smoker and very physically active.  He reports today that he is slowly getting back to his baseline.  He continues to experience some mild shortness of breath with overexertion.  He is concerned today as he travels extensively with work and needs a note authorizing him to engage in air travel next month.  He is free of  cough, leg  swelling, or leg pain.  He endorses some residual pleuritic chest pain with deep inhalations and while sleeping.  He is requesting an extension of pain medication.  He reports compliance with Eliquis and has been able to obtain medication without problem.  He denies any gum bleeding, nosebleeds, dark tarry stools, or any discolored urine.  Current medications: Current Outpatient Medications:  .  ELIQUIS STARTER PACK (ELIQUIS STARTER PACK) 5 MG TABS, Take as directed on package: start with two-5mg  tablets twice daily for 7 days. On day 8, switch to one-5mg  tablet twice daily., Disp: 1 each, Rfl: 0 .  HYDROcodone-acetaminophen (NORCO/VICODIN) 5-325 MG tablet, Take 1-2 tablets by mouth every 6 (six) hours as needed for moderate pain., Disp: 20 tablet, Rfl: 0 .  polyvinyl alcohol (LIQUIFILM TEARS) 1.4 % ophthalmic solution, Place 1 drop into both eyes as needed for dry eyes., Disp: , Rfl:  .  acetaminophen-codeine (TYLENOL #3) 300-30 MG tablet, Take 1-2 tablets by mouth every 4 (four) hours as needed for moderate pain or severe pain., Disp: 30 tablet, Rfl: 0 .  apixaban (ELIQUIS) 5 MG TABS tablet, Take 1 tablet (5 mg total) by mouth 2 (two) times daily., Disp: 60 tablet, Rfl: 3   Pertinent family medical history: family history includes Hypertension in his mother.   No Known Allergies  Social History   Socioeconomic History  . Marital status: Single    Spouse name: Not on file  . Number of children: Not on file  . Years  of education: Not on file  . Highest education level: Not on file  Occupational History  . Not on file  Social Needs  . Financial resource strain: Not on file  . Food insecurity:    Worry: Not on file    Inability: Not on file  . Transportation needs:    Medical: Not on file    Non-medical: Not on file  Tobacco Use  . Smoking status: Never Smoker  . Smokeless tobacco: Never Used  Substance and Sexual Activity  . Alcohol use: Yes  . Drug use: No  . Sexual activity:  Yes  Lifestyle  . Physical activity:    Days per week: Not on file    Minutes per session: Not on file  . Stress: Not on file  Relationships  . Social connections:    Talks on phone: Not on file    Gets together: Not on file    Attends religious service: Not on file    Active member of club or organization: Not on file    Attends meetings of clubs or organizations: Not on file    Relationship status: Not on file  . Intimate partner violence:    Fear of current or ex partner: Not on file    Emotionally abused: Not on file    Physically abused: Not on file    Forced sexual activity: Not on file  Other Topics Concern  . Not on file  Social History Narrative  . Not on file    Review of Systems: Pertinent negatives listed in HPI Objective:   Vitals:   07/15/18 0913  BP: 123/82  Pulse: (!) 58  Resp: 17  SpO2: 98%    BP Readings from Last 3 Encounters:  07/15/18 123/82  06/25/18 128/77  06/24/18 109/73    Filed Weights   07/15/18 0913  Weight: 186 lb 3.2 oz (84.5 kg)      Physical Exam: Constitutional: Patient appears well-developed and well-nourished. No distress. HENT: Normocephalic, atraumatic, External right and left ear normal. Oropharynx is clear and moist.  Eyes: Conjunctivae and EOM are normal. PERRLA, no scleral icterus. Neck: Normal ROM. Neck supple. No JVD. No tracheal deviation. No thyromegaly. CVS: RRR, S1/S2 +, no murmurs, no gallops, no carotid bruit.  Pulmonary: Effort and breath sounds normal, no stridor, rhonchi, wheezes, rales.  Abdominal: Soft. BS +, no distension, tenderness, rebound or guarding.  Musculoskeletal: Normal range of motion. No edema and no tenderness.  Neuro: Alert. Normal muscle tone coordination. Normal gait. BUE and BLE strength 5/5.  Skin: Skin is warm and dry. No rash noted. Not diaphoretic. No erythema. No pallor. Psychiatric: Normal mood and affect. Behavior, judgment, thought content normal.  Lab Results (prior  encounters)  Lab Results  Component Value Date   WBC 3.8 07/15/2018   HGB 14.4 07/15/2018   HCT 40.2 07/15/2018   MCV 91 07/15/2018   PLT 245 07/15/2018   Lab Results  Component Value Date   CREATININE 1.20 07/15/2018   BUN 11 07/15/2018   NA 141 07/15/2018   K 4.7 07/15/2018   CL 102 07/15/2018   CO2 25 07/15/2018    No results found for: HGBA1C  No results found for: CHOL, TRIG, HDL, CHOLHDL, VLDL, LDLCALC      Assessment and plan:  1. Encounter to establish care  2. Bilateral pulmonary embolism [facility](HCC) Patient requests a referral to hematology for further evaluation because of hypercoagulability.  Referral has been placed.  We will repeat a CMP  and a CBC today. Patient was advised that he will remain at minimum on anticoagulation therapy for at least 6 months longer if recommended or warranted by hematologist's recommendation. -Refilled Eliquis   3. SOB (shortness of breath) Improving. Allow for additional time. If no improvement, will repeat CXR and possible CTA Given a very short course of pain medication to help with discomfort associated with deep breathing and exertional shortness of breath.  Patient advised that no additional refills will be given on acetaminophen No. 3.  4. Acute deep vein thrombosis (DVT) of left lower extremity, unspecified vein (HCC) - Ambulatory referral to Hematology  5. Anticoagulated - Comprehensive metabolic panel - CBC   Return in about 5 months (around 12/13/2018) for anticoagulation and PE.   The patient was given clear instructions to go to ER or return to medical center if symptoms don't improve, worsen or new problems develop. The patient verbalized understanding. The patient was advised  to call and obtain lab results if they haven't heard anything from out office within 7-10 business days.  Joaquin Courts, FNP Primary Care at [facility] [address] [place] [address] [phone]: [phone]     This note has been created with Dragon speech recognition software and Paediatric nurse. Any transcriptional errors are unintentional.

## 2018-07-16 LAB — COMPREHENSIVE METABOLIC PANEL
ALT: 18 IU/L (ref 0–44)
AST: 19 IU/L (ref 0–40)
Albumin/Globulin Ratio: 1.5 (ref 1.2–2.2)
Albumin: 4.3 g/dL (ref 4.0–5.0)
Alkaline Phosphatase: 57 IU/L (ref 39–117)
BILIRUBIN TOTAL: 0.4 mg/dL (ref 0.0–1.2)
BUN/Creatinine Ratio: 9 (ref 9–20)
BUN: 11 mg/dL (ref 6–20)
CO2: 25 mmol/L (ref 20–29)
Calcium: 9.5 mg/dL (ref 8.7–10.2)
Chloride: 102 mmol/L (ref 96–106)
Creatinine, Ser: 1.2 mg/dL (ref 0.76–1.27)
GFR calc Af Amer: 89 mL/min/{1.73_m2} (ref >59)
GFR calc non Af Amer: 77 mL/min/{1.73_m2} (ref >59)
GLOBULIN, TOTAL: 2.8 g/dL (ref 1.5–4.5)
Glucose: 88 mg/dL (ref 65–99)
Potassium: 4.7 mmol/L (ref 3.5–5.2)
SODIUM: 141 mmol/L (ref 134–144)
Total Protein: 7.1 g/dL (ref 6.0–8.5)

## 2018-07-16 LAB — CBC
Hematocrit: 40.2 % (ref 37.5–51.0)
Hemoglobin: 14.4 g/dL (ref 13.0–17.7)
MCH: 32.7 pg (ref 26.6–33.0)
MCHC: 35.8 g/dL — AB (ref 31.5–35.7)
MCV: 91 fL (ref 79–97)
Platelets: 245 10*3/uL (ref 150–450)
RBC: 4.4 x10E6/uL (ref 4.14–5.80)
RDW: 12.6 % (ref 11.6–15.4)
WBC: 3.8 10*3/uL (ref 3.4–10.8)

## 2018-07-25 NOTE — Progress Notes (Signed)
Patient notified of results & recommendations. Expressed understanding.

## 2018-08-18 ENCOUNTER — Telehealth: Payer: Self-pay | Admitting: Internal Medicine

## 2018-08-18 NOTE — Telephone Encounter (Signed)
A new hem appt has been scheduled for the pt to see Dr. Melton Alar on 4/2 at 1pm. Pt aware to arrive 15 minutes early.

## 2018-09-08 ENCOUNTER — Inpatient Hospital Stay: Payer: BLUE CROSS/BLUE SHIELD | Attending: Internal Medicine | Admitting: Internal Medicine

## 2018-09-08 ENCOUNTER — Other Ambulatory Visit: Payer: Self-pay

## 2018-09-08 ENCOUNTER — Inpatient Hospital Stay: Payer: BLUE CROSS/BLUE SHIELD

## 2018-09-08 VITALS — BP 136/91 | HR 55 | Temp 98.5°F | Resp 18 | Ht 68.0 in | Wt 198.0 lb

## 2018-09-08 DIAGNOSIS — Z7289 Other problems related to lifestyle: Secondary | ICD-10-CM | POA: Diagnosis not present

## 2018-09-08 DIAGNOSIS — I2699 Other pulmonary embolism without acute cor pulmonale: Secondary | ICD-10-CM | POA: Insufficient documentation

## 2018-09-08 DIAGNOSIS — I82402 Acute embolism and thrombosis of unspecified deep veins of left lower extremity: Secondary | ICD-10-CM | POA: Diagnosis not present

## 2018-09-08 DIAGNOSIS — Z7901 Long term (current) use of anticoagulants: Secondary | ICD-10-CM | POA: Diagnosis not present

## 2018-09-08 DIAGNOSIS — Z823 Family history of stroke: Secondary | ICD-10-CM

## 2018-09-08 DIAGNOSIS — I82492 Acute embolism and thrombosis of other specified deep vein of left lower extremity: Secondary | ICD-10-CM | POA: Diagnosis not present

## 2018-09-08 DIAGNOSIS — R918 Other nonspecific abnormal finding of lung field: Secondary | ICD-10-CM | POA: Diagnosis not present

## 2018-09-08 DIAGNOSIS — S76319A Strain of muscle, fascia and tendon of the posterior muscle group at thigh level, unspecified thigh, initial encounter: Secondary | ICD-10-CM | POA: Diagnosis not present

## 2018-09-08 LAB — CBC WITH DIFFERENTIAL (CANCER CENTER ONLY)
Abs Immature Granulocytes: 0.01 10*3/uL (ref 0.00–0.07)
Basophils Absolute: 0 10*3/uL (ref 0.0–0.1)
Basophils Relative: 1 %
Eosinophils Absolute: 0 10*3/uL (ref 0.0–0.5)
Eosinophils Relative: 1 %
HCT: 43.1 % (ref 39.0–52.0)
Hemoglobin: 15.2 g/dL (ref 13.0–17.0)
Immature Granulocytes: 0 %
Lymphocytes Relative: 42 %
Lymphs Abs: 1.7 10*3/uL (ref 0.7–4.0)
MCH: 32.6 pg (ref 26.0–34.0)
MCHC: 35.3 g/dL (ref 30.0–36.0)
MCV: 92.5 fL (ref 80.0–100.0)
Monocytes Absolute: 0.2 10*3/uL (ref 0.1–1.0)
Monocytes Relative: 5 %
Neutro Abs: 2.1 10*3/uL (ref 1.7–7.7)
Neutrophils Relative %: 51 %
Platelet Count: 200 10*3/uL (ref 150–400)
RBC: 4.66 MIL/uL (ref 4.22–5.81)
RDW: 13.6 % (ref 11.5–15.5)
WBC Count: 4.1 10*3/uL (ref 4.0–10.5)
nRBC: 0 % (ref 0.0–0.2)

## 2018-09-08 LAB — CMP (CANCER CENTER ONLY)
ALT: 25 U/L (ref 0–44)
AST: 31 U/L (ref 15–41)
Albumin: 4.2 g/dL (ref 3.5–5.0)
Alkaline Phosphatase: 57 U/L (ref 38–126)
Anion gap: 9 (ref 5–15)
BUN: 8 mg/dL (ref 6–20)
CO2: 26 mmol/L (ref 22–32)
Calcium: 8.9 mg/dL (ref 8.9–10.3)
Chloride: 103 mmol/L (ref 98–111)
Creatinine: 1.1 mg/dL (ref 0.61–1.24)
GFR, Est AFR Am: >60 mL/min (ref >60)
GFR, Estimated: >60 mL/min (ref >60)
Glucose, Bld: 86 mg/dL (ref 70–99)
Potassium: 3.8 mmol/L (ref 3.5–5.1)
Sodium: 138 mmol/L (ref 135–145)
Total Bilirubin: 0.9 mg/dL (ref 0.3–1.2)
Total Protein: 7.9 g/dL (ref 6.5–8.1)

## 2018-09-08 LAB — LACTATE DEHYDROGENASE: LDH: 171 U/L (ref 98–192)

## 2018-09-08 LAB — FERRITIN: Ferritin: 101 ng/mL (ref 24–336)

## 2018-09-08 NOTE — Progress Notes (Signed)
Referring Physician:  Godfrey Pick. Tiburcio Pea, FNP and Blanchard Mane, MD  Diagnosis Bilateral pulmonary embolism [facility]) - Plan: CBC with Differential (Cancer Center Only), CMP (Cancer Center only), Lactate dehydrogenase (LDH), Ferritin, Factor 5 Leiden*, Prothrombin gene mutation*, Lupus anticoagulant panel*, Beta-2-glycoprotein i abs, IgG/M/A, SPEP with reflex to IFE  Acute deep vein thrombosis (DVT) of other specified vein of left lower extremity (HCC) - Plan: CBC with Differential (Cancer Center Only), CMP (Cancer Center only), Lactate dehydrogenase (LDH), Ferritin, Factor 5 Leiden*, Prothrombin gene mutation*, Lupus anticoagulant panel*, Beta-2-glycoprotein i abs, IgG/M/A, SPEP with reflex to IFE  Staging Cancer Staging No matching staging information was found for the patient.  Assessment and Plan:  1.  DVT and PE.  38 year old male referred for evaluation due to PE and DVT.   Pt reports he traveled to [place] [place] [facility] in January 2020.  He reports he travels frequently for work.  He reports he plays soccer and pulled a hamstring and had little or no activity for several week after injury.  He noticed some pain and swelling and SOB and was seen for evaluation with CTA done 06/24/2018 that showed  IMPRESSION: 1. Lower lobe pulmonary emboli bilaterally involving several lower lobe pulmonary branches bilaterally. There is also embolus in a portion of the right inter lobar pulmonary artery, arising at the junction of the right main and right intralobar pulmonary arteries. No more central pulmonary emboli evident. No right heart strain demonstrable.  2. Opacification along the periphery of the left lower lobe involving portions of the anterior and lateral segments. This opacity is not wedge-shaped. Suspect pneumonia as more likely than pulmonary infarct. A degree of pulmonary infarct superimposed on pneumonia is certainly possible. There is bibasilar atelectasis posteriorly as well.  3.  No thoracic  adenopathy.  4. Slight reflux of contrast into the inferior vena cava may indicate a degree of increase in right heart pressure.  5.  No thoracic aortic aneurysm or dissection.  Venous Doppler of the lower extremities showed left lower extremity acute DVT.   Pt was hospitalized from 06/24/2018 and discharged 06/25/2018.  He was started on Eliquis while hospitalized and reports symptoms improved with therapy.  He denies any bleeding or bruising Pt denies prior clot or recent surgery.  He denies family history of thrombosis.  Pt is seen today for consultation due to DVT and PE.    Labs done today 09/08/2018 reviewed and showed WBC 4.1 HB 15.2 plts 200,000.  He has normal differential.  Chemistries WNL with K+ 3.8 Cr 1.10 and normal LFTs.  Ferritin WNL at 101.    Long talk held with pt.  He likely has provoked DVT and PE due to reported extensive travel history and sedentary lifestyle after injury.  I discussed with him he will undergo hypercoagulable evaluation and is recommended for a minimum of 6 months of anticoagulation.  Rx for Eliquis sent to pharmacy.  He will be set up for repeat imaging in 12/2018.  Pt will have phone visit follow-up in 2 weeks to go over lab results    2.  Hamstring injury.  Pt encouraged to remain active due to DVT and PE diagnosis.  Follow-up with PCP or ortho if no improvement in symptoms.    3.  Family history of stroke.  Pt undergoing hypercoagulable evaluation.    40 minutes spent with more than 50% spent in review of records, counseling and coordination of care.    HPI:  38 year old male referred for evaluation  due to PE and DVT.   Pt reports he traveled to [facility] in January 2020.  He reports he travels frequently for work.  He reports he plays soccer and pulled a hamstring and had little or no activity for several week after injury.  He noticed some pain and swelling and SOB and was seen for evaluation with CTA done 06/24/2018 that showed  IMPRESSION: 1. Lower  lobe pulmonary emboli bilaterally involving several lower lobe pulmonary branches bilaterally. There is also embolus in a portion of the right inter lobar pulmonary artery, arising at the junction of the right main and right intralobar pulmonary arteries. No more central pulmonary emboli evident. No right heart strain demonstrable.  2. Opacification along the periphery of the left lower lobe involving portions of the anterior and lateral segments. This opacity is not wedge-shaped. Suspect pneumonia as more likely than pulmonary infarct. A degree of pulmonary infarct superimposed on pneumonia is certainly possible. There is bibasilar atelectasis posteriorly as well.  3.  No thoracic adenopathy.  4. Slight reflux of contrast into the inferior vena cava may indicate a degree of increase in right heart pressure.  5.  No thoracic aortic aneurysm or dissection.  Venous Doppler of the lower extremities showed left lower extremity acute DVT.   Pt was hospitalized from 06/24/2018 and discharged 06/25/2018.  He was started on Eliquis while hospitalized and reports symptoms improved with therapy.  He denies any bleeding or bruising Pt denies prior clot or recent surgery.  He denies family history of thrombosis.  Pt is seen today for consultation due to DVT and PE.    Problem List Patient Active Problem List   Diagnosis Date Noted  . Bilateral pulmonary embolism (HCC) [I26.99] 06/24/2018  . Pulmonary infarct (HCC) [I26.99] 06/24/2018  . Pulmonary embolism (HCC) [I26.99] 06/24/2018  . Chest pain [R07.9] 06/23/2018    Past Medical History DVT and PE diagnosed 06/2018  Past Surgical History No past surgical history on file.  Family History Family History  Problem Relation Age of Onset  . Hypertension Mother   . Stroke Neg Hx      Social History  reports that he has never smoked. He has never used smokeless tobacco. He reports current alcohol use. He reports that he does not use  drugs.  Medications  Current Outpatient Medications:  .  apixaban (ELIQUIS) 5 MG TABS tablet, Take 1 tablet (5 mg total) by mouth 2 (two) times daily., Disp: 60 tablet, Rfl: 3  Allergies Patient has no known allergies.  Review of Systems Review of Systems - Oncology ROS negative   Physical Exam  Vitals Wt Readings from Last 3 Encounters:  09/08/18 198 lb (89.8 kg)  07/15/18 186 lb 3.2 oz (84.5 kg)  06/24/18 190 lb 1.6 oz (86.2 kg)   Temp Readings from Last 3 Encounters:  09/08/18 98.5 F (36.9 C) (Oral)  06/25/18 99.4 F (37.4 C) (Oral)  06/24/18 98.7 F (37.1 C) (Oral)   BP Readings from Last 3 Encounters:  09/08/18 (!) 136/91  07/15/18 123/82  06/25/18 128/77   Pulse Readings from Last 3 Encounters:  09/08/18 (!) 55  07/15/18 (!) 58  06/25/18 82   Constitutional: Well-developed, well-nourished, and in no distress.   HENT: Head: Normocephalic and atraumatic.  Mouth/Throat: No oropharyngeal exudate. Mucosa moist. Eyes: Pupils are equal, round, and reactive to light. Conjunctivae are normal. No scleral icterus.  Neck: Normal range of motion. Neck supple. No JVD present.  Cardiovascular: Normal rate, regular rhythm and normal  heart sounds.  Exam reveals no gallop and no friction rub.   No murmur heard. Pulmonary/Chest: Effort normal and breath sounds normal. No respiratory distress. No wheezes.No rales.  Abdominal: Soft. Bowel sounds are normal. No distension. There is no tenderness. There is no guarding.  Musculoskeletal: No edema or tenderness.  Lymphadenopathy: No cervical,axillary or supraclavicular adenopathy.  Neurological: Alert and oriented to person, place, and time. No cranial nerve deficit.  Skin: Skin is warm and dry. No rash noted. No erythema. No pallor.  Psychiatric: Affect and judgment normal.   Labs Appointment on 09/08/2018  Component Date Value Ref Range Status  . Ferritin 09/08/2018 101  24 - 336 ng/mL Final   Performed at [facility], [address], [place], [place] [address]  . LDH 09/08/2018 171  98 - 192 U/L Final   Performed at [facility], [address], [place], [place] [address]  . Sodium 09/08/2018 138  135 - 145 mmol/L Final  . Potassium 09/08/2018 3.8  3.5 - 5.1 mmol/L Final  . Chloride 09/08/2018 103  98 - 111 mmol/L Final  . CO2 09/08/2018 26  22 - 32 mmol/L Final  . Glucose, Bld 09/08/2018 86  70 - 99 mg/dL Final  . BUN 72/62/0355 8  6 - 20 mg/dL Final  . Creatinine 97/41/6384 1.10  0.61 - 1.24 mg/dL Final  . Calcium 53/64/6803 8.9  8.9 - 10.3 mg/dL Final  . Total Protein 09/08/2018 7.9  6.5 - 8.1 g/dL Final  . Albumin 21/22/4825 4.2  3.5 - 5.0 g/dL Final  . AST 00/37/0488 31  15 - 41 U/L Final  . ALT 09/08/2018 25  0 - 44 U/L Final  . Alkaline Phosphatase 09/08/2018 57  38 - 126 U/L Final  . Total Bilirubin 09/08/2018 0.9  0.3 - 1.2 mg/dL Final  . GFR, Est Non Af Am 09/08/2018 >60  >60 mL/min Final  . GFR, Est AFR Am 09/08/2018 >60  >60 mL/min Final  . Anion gap 09/08/2018 9  5 - 15 Final   Performed at [facility], [address] [place], [place] [address]  . WBC Count 09/08/2018 4.1  4.0 - 10.5 K/uL Final  . RBC 09/08/2018 4.66  4.22 - 5.81 MIL/uL Final  . Hemoglobin 09/08/2018 15.2  13.0 - 17.0 g/dL Final  . HCT 45/08/8880 43.1  39.0 - 52.0 % Final  . MCV 09/08/2018 92.5  80.0 - 100.0 fL Final  . MCH 09/08/2018 32.6  26.0 - 34.0 pg Final  . MCHC 09/08/2018 35.3  30.0 - 36.0 g/dL Final  . RDW 80/08/4915 13.6  11.5 - 15.5 % Final  . Platelet Count 09/08/2018 200  150 - 400 K/uL Final  . nRBC 09/08/2018 0.0  0.0 - 0.2 % Final  . Neutrophils Relative % 09/08/2018 51  % Final  . Neutro Abs 09/08/2018 2.1  1.7 - 7.7 K/uL Final  . Lymphocytes Relative 09/08/2018 42  % Final  . Lymphs Abs 09/08/2018 1.7  0.7 - 4.0 K/uL Final  . Monocytes Relative 09/08/2018 5  % Final  . Monocytes Absolute 09/08/2018 0.2  0.1 - 1.0 K/uL  Final  . Eosinophils Relative 09/08/2018 1  % Final  . Eosinophils Absolute 09/08/2018 0.0  0.0 - 0.5 K/uL Final  . Basophils Relative 09/08/2018 1  % Final  . Basophils Absolute 09/08/2018 0.0  0.0 - 0.1 K/uL Final  . Immature Granulocytes 09/08/2018 0  % Final  .  Abs Immature Granulocytes 09/08/2018 0.01  0.00 - 0.07 K/uL Final   Performed at [facility], [address], [place], [place] [address]     Pathology Orders Placed This Encounter  Procedures  . CBC with Differential (Cancer Center Only)    Standing Status:   Future    Number of Occurrences:   1    Standing Expiration Date:   09/08/2019  . CMP (Cancer Center only)    Standing Status:   Future    Number of Occurrences:   1    Standing Expiration Date:   09/08/2019  . Lactate dehydrogenase (LDH)    Standing Status:   Future    Number of Occurrences:   1    Standing Expiration Date:   09/08/2019  . Ferritin    Standing Status:   Future    Number of Occurrences:   1    Standing Expiration Date:   09/08/2019  . Factor 5 Leiden*    Standing Status:   Future    Number of Occurrences:   1    Standing Expiration Date:   09/08/2019  . Prothrombin gene mutation*    Standing Status:   Future    Number of Occurrences:   1    Standing Expiration Date:   09/08/2019  . Lupus anticoagulant panel*    Standing Status:   Future    Number of Occurrences:   1    Standing Expiration Date:   09/08/2019  . Beta-2-glycoprotein i abs, IgG/M/A    Standing Status:   Future    Number of Occurrences:   1    Standing Expiration Date:   09/08/2019  . SPEP with reflex to IFE    Standing Status:   Future    Number of Occurrences:   1    Standing Expiration Date:   09/08/2019       Ahmed Prima MD

## 2018-09-09 ENCOUNTER — Telehealth: Payer: Self-pay | Admitting: Internal Medicine

## 2018-09-09 ENCOUNTER — Other Ambulatory Visit: Payer: Self-pay | Admitting: Internal Medicine

## 2018-09-09 LAB — DRVVT MIX: dRVVT Mix: 46.2 s (ref 0.0–47.0)

## 2018-09-09 LAB — LUPUS ANTICOAGULANT PANEL
DRVVT: 58.7 s — ABNORMAL HIGH (ref 0.0–47.0)
PTT Lupus Anticoagulant: 29.7 s (ref 0.0–51.9)

## 2018-09-09 LAB — PROTEIN ELECTROPHORESIS, SERUM, WITH REFLEX
A/G Ratio: 1.3 (ref 0.7–1.7)
Albumin ELP: 4.1 g/dL (ref 2.9–4.4)
Alpha-1-Globulin: 0.2 g/dL (ref 0.0–0.4)
Alpha-2-Globulin: 0.6 g/dL (ref 0.4–1.0)
Beta Globulin: 1.1 g/dL (ref 0.7–1.3)
Gamma Globulin: 1.3 g/dL (ref 0.4–1.8)
Globulin, Total: 3.2 g/dL (ref 2.2–3.9)
Total Protein ELP: 7.3 g/dL (ref 6.0–8.5)

## 2018-09-09 LAB — BETA-2-GLYCOPROTEIN I ABS, IGG/M/A
Beta-2 Glyco I IgG: <9 GPI IgG units (ref 0–20)
Beta-2-Glycoprotein I IgA: <9 GPI IgA units (ref 0–25)
Beta-2-Glycoprotein I IgM: <9 GPI IgM units (ref 0–32)

## 2018-09-09 MED ORDER — APIXABAN 5 MG PO TABS: 5.0000 mg | ORAL_TABLET | Freq: Two times a day (BID) | ORAL | 3 refills | Status: DC

## 2018-09-09 NOTE — Telephone Encounter (Signed)
Tried to reach regarding 4/16 I did leave a message

## 2018-09-13 LAB — PROTHROMBIN GENE MUTATION

## 2018-09-15 LAB — FACTOR 5 LEIDEN

## 2018-09-22 ENCOUNTER — Other Ambulatory Visit: Payer: Self-pay | Admitting: Internal Medicine

## 2018-09-22 ENCOUNTER — Inpatient Hospital Stay (HOSPITAL_BASED_OUTPATIENT_CLINIC_OR_DEPARTMENT_OTHER): Payer: BLUE CROSS/BLUE SHIELD | Admitting: Internal Medicine

## 2018-09-22 DIAGNOSIS — I82492 Acute embolism and thrombosis of other specified deep vein of left lower extremity: Secondary | ICD-10-CM | POA: Diagnosis not present

## 2018-09-22 DIAGNOSIS — I2699 Other pulmonary embolism without acute cor pulmonale: Secondary | ICD-10-CM

## 2018-09-22 DIAGNOSIS — Z7901 Long term (current) use of anticoagulants: Secondary | ICD-10-CM

## 2018-09-22 NOTE — Progress Notes (Signed)
Virtual Visit via Telephone Note  I connected with Stephen Farmer on 09/22/18 at  2:20 PM EDT by telephone and verified that I am speaking with the correct person using two identifiers.   I discussed the limitations, risks, security and privacy concerns of performing an evaluation and management service by telephone and the availability of in person appointments. I also discussed with the patient that there may be a patient responsible charge related to this service. The patient expressed understanding and agreed to proceed.  Interval history:  Historical data obtained from note dated 09/08/2018.   38 year old male referred for evaluation due to PE and DVT.   Pt reports he traveled to [facility] in January 2020.  He reports he travels frequently for work.  He reports he plays soccer and pulled a hamstring and had little or no activity for several week after injury.  He noticed some pain and swelling and SOB and was seen for evaluation with CTA done 06/24/2018 that showed  IMPRESSION: 1. Lower lobe pulmonary emboli bilaterally involving several lower lobe pulmonary branches bilaterally. There is also embolus in a portion of the right inter lobar pulmonary artery, arising at the junction of the right main and right intralobar pulmonary arteries. No more central pulmonary emboli evident. No right heart strain demonstrable.  2. Opacification along the periphery of the left lower lobe involving portions of the anterior and lateral segments. This opacity is not wedge-shaped. Suspect pneumonia as more likely than pulmonary infarct. A degree of pulmonary infarct superimposed on pneumonia is certainly possible. There is bibasilar atelectasis posteriorly as well.  3.  No thoracic adenopathy.  4. Slight reflux of contrast into the inferior vena cava may indicate a degree of increase in right heart pressure.  5.  No thoracic aortic aneurysm or dissection.  Venous Doppler of the lower extremities  showed left lower extremity acute DVT.   Pt was hospitalized from 06/24/2018 and discharged 06/25/2018.  He was started on Eliquis while hospitalized and reports symptoms improved with therapy.  He denies any bleeding or bruising Pt denies prior clot or recent surgery.  He denies family history of thrombosis.     Observations/Objective: Review of labs done 09/08/2018.     Assessment and Plan:   1.  DVT and PE.  38 year old male referred for evaluation due to PE and DVT.   Pt reports he traveled to [facility] in January 2020.  He reports he travels frequently for work.  He reports he plays soccer and pulled a hamstring and had little or no activity for several week after injury.  He noticed some pain and swelling and SOB and was seen for evaluation with CTA done 06/24/2018 that showed  IMPRESSION: 1. Lower lobe pulmonary emboli bilaterally involving several lower lobe pulmonary branches bilaterally. There is also embolus in a portion of the right inter lobar pulmonary artery, arising at the junction of the right main and right intralobar pulmonary arteries. No more central pulmonary emboli evident. No right heart strain demonstrable.  2. Opacification along the periphery of the left lower lobe involving portions of the anterior and lateral segments. This opacity is not wedge-shaped. Suspect pneumonia as more likely than pulmonary infarct. A degree of pulmonary infarct superimposed on pneumonia is certainly possible. There is bibasilar atelectasis posteriorly as well.  3.  No thoracic adenopathy.  4. Slight reflux of contrast into the inferior vena cava may indicate a degree of increase in right heart pressure.  5.  No thoracic aortic aneurysm or dissection.  Venous Doppler of the lower extremities showed left lower extremity acute DVT.   Pt was hospitalized from 06/24/2018 and discharged 06/25/2018.  He was started on Eliquis while hospitalized and reports symptoms improved with therapy.   He denies any bleeding or bruising Pt denies prior clot or recent surgery.  He denies family history of thrombosis.  Pt is seen today for consultation due to DVT and PE.    Labs done 09/08/2018 reviewed and showed WBC 4.1 HB 15.2 plts 200,000.  He has normal differential.  Chemistries WNL with K+ 3.8 Cr 1.10 and normal LFTs.  Ferritin WNL at 101.    He has provoked DVT and PE likely due to reported extensive travel history and sedentary lifestyle after injury.  Labs for hypercoagulable evaluation was done 09/08/2018 and showed normal Factor V leiden, PT gene mutation, Lupus anticoagulant, Beta 2 Glycoprotein ab.   Pt is recommended for a minimum of 6 months of anticoagulation. He will be set up for CT angio of chest and bilateral LE dopplers in 12/2018.  He will follow-up in 12/2018 to go over labs and imaging results.    2.  Hamstring injury.  Pt encouraged to remain active due to DVT and PE diagnosis. SPEP done 09/08/2018 was negative.   Follow-up with PCP or ortho if no improvement in symptoms.    3.  Family history of stroke.  Pt's hypercoagulable evaluation was negative.    Follow Up Instructions: CT angio of chest and bilateral LE dopplers and labs in mid July 2020.      I discussed the assessment and treatment plan with the patient. The patient was provided an opportunity to ask questions and all were answered. The patient agreed with the plan and demonstrated an understanding of the instructions.   The patient was advised to call back or seek an in-person evaluation if the symptoms worsen or if the condition fails to improve as anticipated.  I provided 15 minutes of non-face-to-face time during this encounter.   Ahmed PrimaVetta Marissia Blackham, MD

## 2018-11-08 ENCOUNTER — Ambulatory Visit: Payer: BLUE CROSS/BLUE SHIELD | Admitting: Family Medicine

## 2018-12-26 ENCOUNTER — Ambulatory Visit (HOSPITAL_COMMUNITY): Payer: Self-pay | Attending: Internal Medicine

## 2018-12-28 ENCOUNTER — Inpatient Hospital Stay: Payer: Self-pay | Admitting: Internal Medicine

## 2018-12-28 ENCOUNTER — Inpatient Hospital Stay: Payer: Self-pay | Attending: Internal Medicine

## 2019-05-25 ENCOUNTER — Telehealth: Payer: Self-pay | Admitting: Hematology and Oncology

## 2019-05-25 NOTE — Telephone Encounter (Signed)
[facility] transfer to [place]. Left message re calling office to set up appointment.  

## 2019-11-27 ENCOUNTER — Ambulatory Visit (INDEPENDENT_AMBULATORY_CARE_PROVIDER_SITE_OTHER): Payer: 59 | Admitting: Family Medicine

## 2019-11-27 ENCOUNTER — Other Ambulatory Visit (HOSPITAL_COMMUNITY)
Admission: RE | Admit: 2019-11-27 | Discharge: 2019-11-27 | Disposition: A | Discharge status: Home / Self Care | Payer: 59 | Source: Ambulatory Visit | Attending: Family Medicine | Admitting: Family Medicine

## 2019-11-27 ENCOUNTER — Encounter: Payer: Self-pay | Admitting: Family Medicine

## 2019-11-27 ENCOUNTER — Other Ambulatory Visit: Payer: Self-pay

## 2019-11-27 VITALS — BP 120/84 | HR 70 | Temp 98.5°F | Ht 68.0 in | Wt 201.0 lb

## 2019-11-27 DIAGNOSIS — Z0000 Encounter for general adult medical examination without abnormal findings: Secondary | ICD-10-CM

## 2019-11-27 DIAGNOSIS — Z86711 Personal history of pulmonary embolism: Secondary | ICD-10-CM | POA: Diagnosis not present

## 2019-11-27 DIAGNOSIS — Z113 Encounter for screening for infections with a predominantly sexual mode of transmission: Secondary | ICD-10-CM | POA: Insufficient documentation

## 2019-11-27 DIAGNOSIS — Z1159 Encounter for screening for other viral diseases: Secondary | ICD-10-CM | POA: Insufficient documentation

## 2019-11-27 MED ORDER — APIXABAN 5 MG PO TABS: 5.0000 mg | ORAL_TABLET | Freq: Two times a day (BID) | ORAL | 0 refills | Status: DC

## 2019-11-27 NOTE — Patient Instructions (Signed)
Health Maintenance, Male Adopting a healthy lifestyle and getting preventive care are important in promoting health and wellness. Ask your health care provider about:  The right schedule for you to have regular tests and exams.  Things you can do on your own to prevent diseases and keep yourself healthy. What should I know about diet, weight, and exercise? Eat a healthy diet   Eat a diet that includes plenty of vegetables, fruits, low-fat dairy products, and lean protein.  Do not eat a lot of foods that are high in solid fats, added sugars, or sodium. Maintain a healthy weight Body mass index (BMI) is a measurement that can be used to identify possible weight problems. It estimates body fat based on height and weight. Your health care provider can help determine your BMI and help you achieve or maintain a healthy weight. Get regular exercise Get regular exercise. This is one of the most important things you can do for your health. Most adults should:  Exercise for at least 150 minutes each week. The exercise should increase your heart rate and make you sweat (moderate-intensity exercise).  Do strengthening exercises at least twice a week. This is in addition to the moderate-intensity exercise.  Spend less time sitting. Even light physical activity can be beneficial. Watch cholesterol and blood lipids Have your blood tested for lipids and cholesterol at 39 years of age, then have this test every 5 years. You may need to have your cholesterol levels checked more often if:  Your lipid or cholesterol levels are high.  You are older than 39 years of age.  You are at high risk for heart disease. What should I know about cancer screening? Many types of cancers can be detected early and may often be prevented. Depending on your health history and family history, you may need to have cancer screening at various ages. This may include screening for:  Colorectal cancer.  Prostate  cancer.  Skin cancer.  Lung cancer. What should I know about heart disease, diabetes, and high blood pressure? Blood pressure and heart disease  High blood pressure causes heart disease and increases the risk of stroke. This is more likely to develop in people who have high blood pressure readings, are of African descent, or are overweight.  Talk with your health care provider about your target blood pressure readings.  Have your blood pressure checked: ? Every 3-5 years if you are 30-46 years of age. ? Every year if you are 78 years old or older.  If you are between the ages of 69 and 47 and are a current or former smoker, ask your health care provider if you should have a one-time screening for abdominal aortic aneurysm (AAA). Diabetes Have regular diabetes screenings. This checks your fasting blood sugar level. Have the screening done:  Once every three years after age 60 if you are at a normal weight and have a low risk for diabetes.  More often and at a younger age if you are overweight or have a high risk for diabetes. What should I know about preventing infection? Hepatitis B If you have a higher risk for hepatitis B, you should be screened for this virus. Talk with your health care provider to find out if you are at risk for hepatitis B infection. Hepatitis C Blood testing is recommended for:  Everyone born from [birth year] through 1965.  Anyone with known risk factors for hepatitis C. Sexually transmitted infections (STIs)  You should be screened each year  for STIs, including gonorrhea and chlamydia, if: ? You are sexually active and are younger than 39 years of age. ? You are older than 39 years of age and your health care provider tells you that you are at risk for this type of infection. ? Your sexual activity has changed since you were last screened, and you are at increased risk for chlamydia or gonorrhea. Ask your health care provider if you are at risk.  Ask your  health care provider about whether you are at high risk for HIV. Your health care provider may recommend a prescription medicine to help prevent HIV infection. If you choose to take medicine to prevent HIV, you should first get tested for HIV. You should then be tested every 3 months for as long as you are taking the medicine. Follow these instructions at home: Lifestyle  Do not use any products that contain nicotine or tobacco, such as cigarettes, e-cigarettes, and chewing tobacco. If you need help quitting, ask your health care provider.  Do not use street drugs.  Do not share needles.  Ask your health care provider for help if you need support or information about quitting drugs. Alcohol use  Do not drink alcohol if your health care provider tells you not to drink.  If you drink alcohol: ? Limit how much you have to 0-2 drinks a day. ? Be aware of how much alcohol is in your drink. In the [place] one drink equals one 12 oz bottle of beer (355 mL), one 5 oz glass of wine (148 mL), or one 1 oz glass of hard liquor (44 mL). General instructions  Schedule regular health, dental, and eye exams.  Stay current with your vaccines.  Tell your health care provider if: ? You often feel depressed. ? You have ever been abused or do not feel safe at home. Summary  Adopting a healthy lifestyle and getting preventive care are important in promoting health and wellness.  Follow your health care provider's instructions about healthy diet, exercising, and getting tested or screened for diseases.  Follow your health care provider's instructions on monitoring your cholesterol and blood pressure. This information is not intended to replace advice given to you by your health care provider. Make sure you discuss any questions you have with your health care provider. Document Revised: 05/18/2018 Document Reviewed: 05/18/2018 Elsevier Patient Education  2020 Elsevier Inc.  Preventive Care 21-39 Years  Old, Male Preventive care refers to lifestyle choices and visits with your health care provider that can promote health and wellness. This includes:  A yearly physical exam. This is also called an annual well check.  Regular dental and eye exams.  Immunizations.  Screening for certain conditions.  Healthy lifestyle choices, such as eating a healthy diet, getting regular exercise, not using drugs or products that contain nicotine and tobacco, and limiting alcohol use. What can I expect for my preventive care visit? Physical exam Your health care provider will check:  Height and weight. These may be used to calculate body mass index (BMI), which is a measurement that tells if you are at a healthy weight.  Heart rate and blood pressure.  Your skin for abnormal spots. Counseling Your health care provider may ask you questions about:  Alcohol, tobacco, and drug use.  Emotional well-being.  Home and relationship well-being.  Sexual activity.  Eating habits.  Work and work environment. What immunizations do I need?  Influenza (flu) vaccine  This is recommended every year. Tetanus, diphtheria,   and pertussis (Tdap) vaccine  You may need a Td booster every 10 years. Varicella (chickenpox) vaccine  You may need this vaccine if you have not already been vaccinated. Human papillomavirus (HPV) vaccine  If recommended by your health care provider, you may need three doses over 6 months. Measles, mumps, and rubella (MMR) vaccine  You may need at least one dose of MMR. You may also need a second dose. Meningococcal conjugate (MenACWY) vaccine  One dose is recommended if you are 50-22 years old and a Market researcher living in a residence hall, or if you have one of several medical conditions. You may also need additional booster doses. Pneumococcal conjugate (PCV13) vaccine  You may need this if you have certain conditions and were not previously  vaccinated. Pneumococcal polysaccharide (PPSV23) vaccine  You may need one or two doses if you smoke cigarettes or if you have certain conditions. Hepatitis A vaccine  You may need this if you have certain conditions or if you travel or work in places where you may be exposed to hepatitis A. Hepatitis B vaccine  You may need this if you have certain conditions or if you travel or work in places where you may be exposed to hepatitis B. Haemophilus influenzae type b (Hib) vaccine  You may need this if you have certain risk factors. You may receive vaccines as individual doses or as more than one vaccine together in one shot (combination vaccines). Talk with your health care provider about the risks and benefits of combination vaccines. What tests do I need? Blood tests  Lipid and cholesterol levels. These may be checked every 5 years starting at age 55.  Hepatitis C test.  Hepatitis B test. Screening   Diabetes screening. This is done by checking your blood sugar (glucose) after you have not eaten for a while (fasting).  Sexually transmitted disease (STD) testing. Talk with your health care provider about your test results, treatment options, and if necessary, the need for more tests. Follow these instructions at home: Eating and drinking   Eat a diet that includes fresh fruits and vegetables, whole grains, lean protein, and low-fat dairy products.  Take vitamin and mineral supplements as recommended by your health care provider.  Do not drink alcohol if your health care provider tells you not to drink.  If you drink alcohol: ? Limit how much you have to 0-2 drinks a day. ? Be aware of how much alcohol is in your drink. In the [place] one drink equals one 12 oz bottle of beer (355 mL), one 5 oz glass of wine (148 mL), or one 1 oz glass of hard liquor (44 mL). Lifestyle  Take daily care of your teeth and gums.  Stay active. Exercise for at least 30 minutes on 5 or more days  each week.  Do not use any products that contain nicotine or tobacco, such as cigarettes, e-cigarettes, and chewing tobacco. If you need help quitting, ask your health care provider.  If you are sexually active, practice safe sex. Use a condom or other form of protection to prevent STIs (sexually transmitted infections). What's next?  Go to your health care provider once a year for a well check visit.  Ask your health care provider how often you should have your eyes and teeth checked.  Stay up to date on all vaccines. This information is not intended to replace advice given to you by your health care provider. Make sure you discuss any questions you  have with your health care provider. Document Revised: 05/19/2018 Document Reviewed: 05/19/2018 Elsevier Patient Education  2020 Elsevier Inc.  

## 2019-11-27 NOTE — Progress Notes (Signed)
Established Patient Office Visit  Subjective:  Patient ID: Stephen Farmer, male    DOB: 10/12/1980  Age: 39 y.o. MRN: [id number]  CC:  Chief Complaint  Patient presents with   Transitions Of Care    toc from [place],     HPI Stephen Farmer presents for establishment of care and a complete physical exam.  Significant past medical history of pulmonary embolism.  Took Eliquis for 6 months.  Saw hematology work-up was negative.  Has no family history of pulmonary embolism.  It was not clear whether or not he had a preceding DVT.  He is healthy as far as he knows.  He does exercise at least twice weekly.  Fasting today.  Oncologist had given him a prescription of Eliquis to to start if he developed acute painful unprovoked swelling in one of his legs.  He would like a refill on this prescription for reassurance.  History reviewed. No pertinent past medical history.  History reviewed. No pertinent surgical history.  Family History  Problem Relation Age of Onset   Hypertension Mother    Sleep apnea Father    Stroke Neg Hx     Social History   Socioeconomic History   Marital status: Single    Spouse name: Not on file   Number of children: Not on file   Years of education: Not on file   Highest education level: Not on file  Occupational History   Not on file  Tobacco Use   Smoking status: Never Smoker   Smokeless tobacco: Never Used  Vaping Use   Vaping Use: Never used  Substance and Sexual Activity   Alcohol use: Yes    Alcohol/week: 1.0 standard drink    Types: 1 Standard drinks or equivalent per week    Comment: social   Drug use: No   Sexual activity: Yes  Other Topics Concern   Not on file  Social History Narrative   Not on file   Social Determinants of Health   Financial Resource Strain:    Difficulty of Paying Living Expenses:   Food Insecurity:    Worried About Programme researcher, broadcasting/film/video in the Last Year:    Barista in the  Last Year:   Transportation Needs:    Freight forwarder (Medical):    Lack of Transportation (Non-Medical):   Physical Activity:    Days of Exercise per Week:    Minutes of Exercise per Session:   Stress:    Feeling of Stress :   Social Connections:    Frequency of Communication with Friends and Family:    Frequency of Social Gatherings with Friends and Family:    Attends Religious Services:    Active Member of Clubs or Organizations:    Attends Engineer, structural:    Marital Status:   Intimate Partner Violence:    Fear of Current or Ex-Partner:    Emotionally Abused:    Physically Abused:    Sexually Abused:     Outpatient Medications Prior to Visit  Medication Sig Dispense Refill   omeprazole (PRILOSEC) 20 MG capsule Take 20 mg by mouth daily.     apixaban (ELIQUIS) 5 MG TABS tablet Take 1 tablet (5 mg total) by mouth 2 (two) times daily. 60 tablet 3   No facility-administered medications prior to visit.    No Known Allergies  ROS Review of Systems  Constitutional: Negative.   HENT: Negative.   Respiratory: Negative.  Cardiovascular: Negative.   Gastrointestinal: Negative.   Endocrine: Negative for polyphagia and polyuria.  Genitourinary: Negative.   Musculoskeletal: Negative.   Skin: Negative for pallor.  Allergic/Immunologic: Negative for food allergies and immunocompromised state.  Neurological: Negative.   Hematological: Does not bruise/bleed easily.  Psychiatric/Behavioral: Negative.       Objective:    Physical Exam Vitals and nursing note reviewed.  Constitutional:      General: He is not in acute distress.    Appearance: Normal appearance. He is not ill-appearing, toxic-appearing or diaphoretic.  HENT:     Head: Normocephalic and atraumatic.     Right Ear: Tympanic membrane, ear canal and external ear normal.     Left Ear: Tympanic membrane, ear canal and external ear normal.  Eyes:     General: No scleral  icterus.       Right eye: No discharge.        Left eye: No discharge.     Extraocular Movements: Extraocular movements intact.     Conjunctiva/sclera: Conjunctivae normal.     Pupils: Pupils are equal, round, and reactive to light.  Cardiovascular:     Rate and Rhythm: Normal rate and regular rhythm.  Pulmonary:     Effort: Pulmonary effort is normal.     Breath sounds: Normal breath sounds.  Abdominal:     General: Abdomen is flat. Bowel sounds are normal. There is no distension.     Palpations: Abdomen is soft. There is no mass.     Tenderness: There is no abdominal tenderness. There is no guarding or rebound.     Hernia: No hernia is present. There is no hernia in the left inguinal area or right inguinal area.  Genitourinary:    Penis: Normal and circumcised. No hypospadias, erythema, tenderness, discharge, swelling or lesions.      Testes:        Right: Mass, tenderness or swelling not present.        Left: Mass, tenderness or swelling not present.     Epididymis:     Right: Not inflamed or enlarged.     Left: Not inflamed or enlarged.  Musculoskeletal:     Cervical back: Normal range of motion and neck supple. No rigidity or tenderness.  Lymphadenopathy:     Cervical: No cervical adenopathy.     Lower Body: No right inguinal adenopathy. No left inguinal adenopathy.  Skin:    General: Skin is warm and dry.  Neurological:     Mental Status: He is alert and oriented to person, place, and time.  Psychiatric:        Mood and Affect: Mood normal.        Behavior: Behavior normal.     BP 120/84    Pulse 70    Temp 98.5 F (36.9 C) (Tympanic)    Ht 5\' 8"  (1.727 m) Comment: 5 8 1/4   Wt 201 lb (91.2 kg)    SpO2 97%    BMI 30.56 kg/m  Wt Readings from Last 3 Encounters:  11/27/19 201 lb (91.2 kg)  09/08/18 198 lb (89.8 kg)  07/15/18 186 lb 3.2 oz (84.5 kg)     Health Maintenance Due  Topic Date Due   Hepatitis C Screening  Never done   COVID-19 Vaccine (1) Never done    TETANUS/TDAP  Never done    There are no preventive care reminders to display for this patient.  No results found for: TSH Lab Results  Component Value Date  WBC 4.1 09/08/2018   HGB 15.2 09/08/2018   HCT 43.1 09/08/2018   MCV 92.5 09/08/2018   PLT 200 09/08/2018   Lab Results  Component Value Date   NA 138 09/08/2018   K 3.8 09/08/2018   CO2 26 09/08/2018   GLUCOSE 86 09/08/2018   BUN 8 09/08/2018   CREATININE 1.10 09/08/2018   BILITOT 0.9 09/08/2018   ALKPHOS 57 09/08/2018   AST 31 09/08/2018   ALT 25 09/08/2018   PROT 7.9 09/08/2018   ALBUMIN 4.2 09/08/2018   CALCIUM 8.9 09/08/2018   ANIONGAP 9 09/08/2018   No results found for: CHOL No results found for: HDL No results found for: LDLCALC No results found for: TRIG No results found for: CHOLHDL No results found for: ZGYF7C    Assessment & Plan:   Problem List Items Addressed This Visit      Other   Screen for STD (sexually transmitted disease)   Relevant Orders   Urine cytology ancillary only   RPR   History of pulmonary embolism - Primary   Relevant Medications   apixaban (ELIQUIS) 5 MG TABS tablet   Other Relevant Orders   D-Dimer, Quantitative    Other Visit Diagnoses    Healthcare maintenance       Relevant Orders   CBC   Comprehensive metabolic panel   Lipid panel   HIV Antibody (routine testing w rflx)   Hepatitis C antibody   Urinalysis, Routine w reflex microscopic      Meds ordered this encounter  Medications   apixaban (ELIQUIS) 5 MG TABS tablet    Sig: Take 1 tablet (5 mg total) by mouth 2 (two) times daily.    Dispense:  60 tablet    Refill:  0    Please hold    Follow-up: Return in about 1 year (around 11/26/2020), or if symptoms worsen or fail to improve.  Given information on health maintenance and disease prevention.  Baseline D-dimer checked today.  Mliss Sax, MD

## 2019-11-28 LAB — CBC
HCT: 46.1 % (ref 39.0–52.0)
Hemoglobin: 15.9 g/dL (ref 13.0–17.0)
MCHC: 34.6 g/dL (ref 30.0–36.0)
MCV: 100.2 fl — ABNORMAL HIGH (ref 78.0–100.0)
Platelets: 245 10*3/uL (ref 150.0–400.0)
RBC: 4.6 Mil/uL (ref 4.22–5.81)
RDW: 14.2 % (ref 11.5–15.5)
WBC: 4 10*3/uL (ref 4.0–10.5)

## 2019-11-28 LAB — URINALYSIS, ROUTINE W REFLEX MICROSCOPIC
Bilirubin Urine: NEGATIVE
Hgb urine dipstick: NEGATIVE
Ketones, ur: NEGATIVE
Leukocytes,Ua: NEGATIVE
Nitrite: NEGATIVE
RBC / HPF: NONE SEEN (ref 0–?)
Specific Gravity, Urine: 1.025 (ref 1.000–1.030)
Total Protein, Urine: NEGATIVE
Urine Glucose: NEGATIVE
Urobilinogen, UA: 0.2 (ref 0.0–1.0)
WBC, UA: NONE SEEN (ref 0–?)
pH: 5.5 (ref 5.0–8.0)

## 2019-11-28 LAB — D-DIMER, QUANTITATIVE: D-Dimer, Quant: 0.64 mcg/mL FEU — ABNORMAL HIGH (ref <0.50)

## 2019-11-28 LAB — COMPREHENSIVE METABOLIC PANEL
ALT: 29 U/L (ref 0–53)
AST: 32 U/L (ref 0–37)
Albumin: 4.7 g/dL (ref 3.5–5.2)
Alkaline Phosphatase: 61 U/L (ref 39–117)
BUN: 12 mg/dL (ref 6–23)
CO2: 24 mEq/L (ref 19–32)
Calcium: 9.6 mg/dL (ref 8.4–10.5)
Chloride: 105 mEq/L (ref 96–112)
Creatinine, Ser: 1.03 mg/dL (ref 0.40–1.50)
GFR: 97.54 mL/min (ref >60.00)
Glucose, Bld: 73 mg/dL (ref 70–99)
Potassium: 4.1 mEq/L (ref 3.5–5.1)
Sodium: 139 mEq/L (ref 135–145)
Total Bilirubin: 0.5 mg/dL (ref 0.2–1.2)
Total Protein: 7.8 g/dL (ref 6.0–8.3)

## 2019-11-28 LAB — LIPID PANEL
Cholesterol: 311 mg/dL — ABNORMAL HIGH (ref 0–200)
HDL: 61.5 mg/dL (ref >39.00)
LDL Cholesterol: 222 mg/dL — ABNORMAL HIGH (ref 0–99)
NonHDL: 249.62
Total CHOL/HDL Ratio: 5
Triglycerides: 140 mg/dL (ref 0.0–149.0)
VLDL: 28 mg/dL (ref 0.0–40.0)

## 2019-11-28 LAB — HEPATITIS C ANTIBODY
Hepatitis C Ab: NONREACTIVE
SIGNAL TO CUT-OFF: 0.03 (ref <1.00)

## 2019-11-28 LAB — RPR: RPR Ser Ql: NONREACTIVE

## 2019-11-28 LAB — HIV ANTIBODY (ROUTINE TESTING W REFLEX): HIV 1&2 Ab, 4th Generation: NONREACTIVE

## 2019-11-29 LAB — URINE CYTOLOGY ANCILLARY ONLY
Chlamydia: NEGATIVE
Comment: NEGATIVE
Comment: NORMAL
Neisseria Gonorrhea: NEGATIVE

## 2020-04-27 ENCOUNTER — Other Ambulatory Visit: Payer: Self-pay

## 2020-04-27 ENCOUNTER — Encounter (HOSPITAL_COMMUNITY): Payer: Self-pay | Admitting: Emergency Medicine

## 2020-04-27 ENCOUNTER — Emergency Department (HOSPITAL_COMMUNITY): Payer: 59

## 2020-04-27 ENCOUNTER — Emergency Department (HOSPITAL_COMMUNITY)
Admission: EM | Admit: 2020-04-27 | Discharge: 2020-04-27 | Disposition: A | Discharge status: Home / Self Care | Payer: 59 | Attending: Emergency Medicine | Admitting: Emergency Medicine

## 2020-04-27 ENCOUNTER — Emergency Department (HOSPITAL_BASED_OUTPATIENT_CLINIC_OR_DEPARTMENT_OTHER): Payer: 59

## 2020-04-27 DIAGNOSIS — I2602 Saddle embolus of pulmonary artery with acute cor pulmonale: Secondary | ICD-10-CM | POA: Diagnosis not present

## 2020-04-27 DIAGNOSIS — Z86711 Personal history of pulmonary embolism: Secondary | ICD-10-CM | POA: Diagnosis not present

## 2020-04-27 DIAGNOSIS — Z20822 Contact with and (suspected) exposure to covid-19: Secondary | ICD-10-CM | POA: Diagnosis not present

## 2020-04-27 DIAGNOSIS — R0602 Shortness of breath: Secondary | ICD-10-CM | POA: Diagnosis present

## 2020-04-27 DIAGNOSIS — R778 Other specified abnormalities of plasma proteins: Secondary | ICD-10-CM | POA: Diagnosis not present

## 2020-04-27 DIAGNOSIS — I2699 Other pulmonary embolism without acute cor pulmonale: Secondary | ICD-10-CM | POA: Diagnosis not present

## 2020-04-27 DIAGNOSIS — I2694 Multiple subsegmental pulmonary emboli without acute cor pulmonale: Secondary | ICD-10-CM | POA: Insufficient documentation

## 2020-04-27 HISTORY — DX: Other pulmonary embolism without acute cor pulmonale: I26.99

## 2020-04-27 LAB — ECHOCARDIOGRAM COMPLETE
Area-P 1/2: 2.83 cm2
Height: 68 in
S' Lateral: 2.7 cm
Weight: 2880 oz

## 2020-04-27 LAB — BASIC METABOLIC PANEL
Anion gap: 11 (ref 5–15)
BUN: 14 mg/dL (ref 6–20)
CO2: 25 mmol/L (ref 22–32)
Calcium: 8.7 mg/dL — ABNORMAL LOW (ref 8.9–10.3)
Chloride: 99 mmol/L (ref 98–111)
Creatinine, Ser: 1.11 mg/dL (ref 0.61–1.24)
GFR, Estimated: >60 mL/min (ref >60)
Glucose, Bld: 94 mg/dL (ref 70–99)
Potassium: 3.5 mmol/L (ref 3.5–5.1)
Sodium: 135 mmol/L (ref 135–145)

## 2020-04-27 LAB — CBC
HCT: 44.5 % (ref 39.0–52.0)
Hemoglobin: 15.4 g/dL (ref 13.0–17.0)
MCH: 34.1 pg — ABNORMAL HIGH (ref 26.0–34.0)
MCHC: 34.6 g/dL (ref 30.0–36.0)
MCV: 98.5 fL (ref 80.0–100.0)
Platelets: 228 10*3/uL (ref 150–400)
RBC: 4.52 MIL/uL (ref 4.22–5.81)
RDW: 13.1 % (ref 11.5–15.5)
WBC: 7.1 10*3/uL (ref 4.0–10.5)
nRBC: 0 % (ref 0.0–0.2)

## 2020-04-27 LAB — TROPONIN I (HIGH SENSITIVITY)
Troponin I (High Sensitivity): 68 ng/L — ABNORMAL HIGH (ref <18)
Troponin I (High Sensitivity): 62 ng/L — ABNORMAL HIGH (ref <18)

## 2020-04-27 LAB — RESP PANEL BY RT-PCR (FLU A&B, COVID) ARPGX2
Influenza A by PCR: NEGATIVE
Influenza B by PCR: NEGATIVE
SARS Coronavirus 2 by RT PCR: NEGATIVE

## 2020-04-27 MED ORDER — OXYCODONE HCL 5 MG PO TABS
5.0000 mg | ORAL_TABLET | Freq: Once | ORAL | Status: AC
  Administered 2020-04-27: 5 mg via ORAL
  Filled 2020-04-27: qty 1

## 2020-04-27 MED ORDER — IOHEXOL 350 MG/ML SOLN
100.0000 mL | Freq: Once | INTRAVENOUS | Status: AC | PRN
  Administered 2020-04-27: 100 mL via INTRAVENOUS

## 2020-04-27 MED ORDER — ACETAMINOPHEN 500 MG PO TABS
1000.0000 mg | ORAL_TABLET | Freq: Once | ORAL | Status: AC
  Administered 2020-04-27: 1000 mg via ORAL
  Filled 2020-04-27: qty 2

## 2020-04-27 MED ORDER — APIXABAN 5 MG PO TABS: 5.0000 mg | ORAL_TABLET | Freq: Two times a day (BID) | ORAL | 0 refills | Status: DC

## 2020-04-27 MED ORDER — APIXABAN 5 MG PO TABS
10.0000 mg | ORAL_TABLET | Freq: Two times a day (BID) | ORAL | Status: DC
  Administered 2020-04-27: 10 mg via ORAL
  Filled 2020-04-27: qty 2

## 2020-04-27 MED ORDER — KETOROLAC TROMETHAMINE 15 MG/ML IJ SOLN
15.0000 mg | Freq: Once | INTRAMUSCULAR | Status: AC
  Administered 2020-04-27: 15 mg via INTRAVENOUS
  Filled 2020-04-27: qty 1

## 2020-04-27 MED ORDER — SODIUM CHLORIDE (PF) 0.9 % IJ SOLN
INTRAMUSCULAR | Status: AC
  Filled 2020-04-27: qty 50

## 2020-04-27 MED ORDER — APIXABAN 5 MG PO TABS: 5.0000 mg | ORAL_TABLET | Freq: Two times a day (BID) | ORAL | Status: DC

## 2020-04-27 MED ORDER — KETOROLAC TROMETHAMINE 10 MG PO TABS
10.0000 mg | ORAL_TABLET | Freq: Four times a day (QID) | ORAL | 0 refills | Status: DC | PRN
Start: 2020-04-27 — End: 2020-05-02

## 2020-04-27 MED ORDER — APIXABAN 5 MG PO TABS
10.0000 mg | ORAL_TABLET | Freq: Two times a day (BID) | ORAL | Status: DC
  Filled 2020-04-27: qty 2

## 2020-04-27 MED ORDER — APIXABAN 5 MG PO TABS
ORAL_TABLET | ORAL | 0 refills | Status: DC
Start: 2020-04-27 — End: 2022-02-24

## 2020-04-27 NOTE — ED Provider Notes (Signed)
39 yo M with a chief complaints of chest pain.  This started yesterday.  Reminded him of when he had a pulmonary embolism in the past.  He had finished his treatment with anticoagulation from his prior PE he thinks back the summer.  That was his first event.  Started having pain yesterday.  No feeling like he may pass out but has been feeling worsening pain and having some low-grade fevers at home.  I received the patient in signout, plan for CT angiogram of the chest.  This is positive for bilateral pulmonary embolus.  No obvious signs of right heart strain.  No tachycardia no hypoxia but the patient has an elevated troponin and low-grade fevers.  Still is significantly uncomfortable on repeat exam.  Will start on Eliquis.  Will discuss with medicine for admission.  Seen by the hospitalist. Not sure that the patient would benefit from admission. Ordered a stat echo and a repeat troponin. Troponin stable. Echo without right heart strain. Will discharge the patient home. He is requesting an anti-inflammatory medicine. Discussed that may increase his risk of upper GI bleeding. PCP follow-up.   Melene Plan, DO 04/27/20 1312

## 2020-04-27 NOTE — Discharge Instructions (Addendum)
I have prescribed you eliquis.  You should take it twice a day for the next week then go back to your once a day regimen.  Follow up with your PCP.   Information on my medicine - ELIQUIS (apixaban)  This medication education was reviewed with me or my healthcare representative as part of my discharge preparation.  The pharmacist that spoke with me during my hospital stay was:   Why was Eliquis prescribed for you? Eliquis was prescribed to treat blood clots that may have been found in the veins of your legs (deep vein thrombosis) or in your lungs (pulmonary embolism) and to reduce the risk of them occurring again.  What do You need to know about Eliquis ? The starting dose is 10 mg (two 5 mg tablets) taken TWICE daily for the FIRST SEVEN (7) DAYS, then on (enter date)  05/04/2020  the dose is reduced to ONE 5 mg tablet taken TWICE daily.  Eliquis may be taken with or without food.   Try to take the dose about the same time in the morning and in the evening. If you have difficulty swallowing the tablet whole please discuss with your pharmacist how to take the medication safely.  Take Eliquis exactly as prescribed and DO NOT stop taking Eliquis without talking to the doctor who prescribed the medication.  Stopping may increase your risk of developing a new blood clot.  Refill your prescription before you run out.  After discharge, you should have regular check-up appointments with your healthcare provider that is prescribing your Eliquis.    What do you do if you miss a dose? If a dose of ELIQUIS is not taken at the scheduled time, take it as soon as possible on the same day and twice-daily administration should be resumed. The dose should not be doubled to make up for a missed dose.  Important Safety Information A possible side effect of Eliquis is bleeding. You should call your healthcare provider right away if you experience any of the following: Bleeding from an injury or your nose  that does not stop. Unusual colored urine (red or dark brown) or unusual colored stools (red or black). Unusual bruising for unknown reasons. A serious fall or if you hit your head (even if there is no bleeding).  Some medicines may interact with Eliquis and might increase your risk of bleeding or clotting while on Eliquis. To help avoid this, consult your healthcare provider or pharmacist prior to using any new prescription or non-prescription medications, including herbals, vitamins, non-steroidal anti-inflammatory drugs (NSAIDs) and supplements.  This website has more information on Eliquis (apixaban): http://[email or web address].com/eliquis/home

## 2020-04-27 NOTE — Consult Note (Addendum)
Medical Consultation  [name] QVZ:[id number] DOB: Oct 17, 1980 DOA: 04/27/2020 PCP: Mliss Sax, MD   Requesting physician: Dr. 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     - patient has a history of pulmonary embolism; finished last course of eliquis back in June 2021; at the time, his embolism was believed secondary to immobility     - his current sats are 97% on room air; HR is 57 to 63, SBP 120+     - his chest pain is c/w pulm infarct (reproducible and painful with deep inspiration); although he has an elevated troponin     - CTA PE is not suggestive of right heart strain     - EKG: sinus brady; no ST changes     - PESI is 48 (0 - 1.6% 30 day mortality)     - recommendations: agree with echo, doppler studies; agree with rpt troponin; at this point he will be on anticoagulation for life, but he should still have a hypercoag workup -- this can be done outpt; eliquis is perfectly fine as he has not failed this therapy     - Echo looks good and his trp is down trending; BLE venous doppler results are pending but this can be followed up outpt.     - pt should be ok for discharge to home with short course of pain meds and an eliquis starter pack; he would need PCP follow up in the next 2 - 4 weeks.   Elevated troponin CP     - EKG is ok. CP is reproducible.     - see above  Please contact me if I can be of assistance in the meanwhile. Thank you for this consultation.  Chief Complaint: right chest pain  HPI:  Stephen Farmer is a 39 y.o. male with medical history significant of PTE. Presenting today with right chest pain. States the current episode started yesterday morning. It was sharp, non-radiating, and constant at the lower anterior ribs. He initially ignored it; however when he woke from a nap, it seemed to increase. The pain styed  constant throughout the day and night. It was unrelieved by APAP. He decided that he had endured enough and came to the ED.   Review of Systems:  Reports right side, pleuritic type CP. Reports dyspnea. Denies palpitations, syncopal episodes, lightheadedness, weakness, leg swelling/pain. Denies recent travel, hx of CA, Hx of afib.  Past Medical History:  Diagnosis Date  . Pulmonary emboli (HCC)    History reviewed. No pertinent surgical history. Social History:  reports that he has never smoked. He has never used smokeless tobacco. He reports current alcohol use of about 1.0 standard drink of alcohol per week. He reports that he does not use drugs.  No Known Allergies Family History  Problem Relation Age of Onset  . Hypertension Mother   . Sleep apnea Father   . Stroke Neg Hx     Prior to Admission medications   Medication Sig Start Date End Date Taking? Authorizing Provider  apixaban (ELIQUIS) 5 MG TABS tablet Take 1 tablet (5 mg total) by mouth 2 (two) times daily. 11/27/19  Yes Mliss Sax, MD  Ascorbic Acid (VITAMIN C ADULT GUMMIES PO) Take 1 tablet by mouth daily.   Yes [provider]  omeprazole (PRILOSEC) 20 MG capsule Take 20 mg by mouth daily as needed (heartburn).  Yes [provider]  apixaban (ELIQUIS) 5 MG TABS tablet Take 2 tablets daily for 7 days then resume your current 5 mg twice daily regimen 04/27/20   Molpus, John, MD   Physical Exam: Blood pressure 124/81, pulse (!) 57, temperature 99.9 F (37.7 C), temperature source Oral, resp. rate 20, height 5\' 8"  (1.727 m), weight 81.6 kg, SpO2 97 %. Vitals:   04/27/20 0700 04/27/20 0830  BP: (!) 136/108 124/81  Pulse: (!) 52 (!) 57  Resp: (!) 23 20  Temp:    SpO2: 99% 97%    General: 39 y.o. male resting in bed in NAD Eyes: PERRL, normal sclera ENMT: Nares patent w/o discharge, orophaynx clear, dentition normal, ears w/o discharge/lesions/ulcers Neck: Supple, trachea  midline Cardiovascular: brady, +S1, S2, no m/g/r, equal pulses throughout; right chest pain to palpation Respiratory: CTABL, no w/r/r, normal WOB GI: BS+, NDNT, no masses noted, no organomegaly noted MSK: No e/c/c Skin: No rashes, bruises, ulcerations noted Neuro: A&O x 3, no focal deficits Psyc: Appropriate interaction and affect, calm/cooperative  Labs on Admission:  Basic Metabolic Panel: Recent Labs  Lab 04/27/20 0409  NA 135  K 3.5  CL 99  CO2 25  GLUCOSE 94  BUN 14  CREATININE 1.11  CALCIUM 8.7*   Liver Function Tests: No results for input(s): AST, ALT, ALKPHOS, BILITOT, PROT, ALBUMIN in the last 168 hours. No results for input(s): LIPASE, AMYLASE in the last 168 hours. No results for input(s): AMMONIA in the last 168 hours. CBC: Recent Labs  Lab 04/27/20 0409  WBC 7.1  HGB 15.4  HCT 44.5  MCV 98.5  PLT 228   Cardiac Enzymes: No results for input(s): CKTOTAL, CKMB, CKMBINDEX, TROPONINI in the last 168 hours. BNP: Invalid input(s): POCBNP CBG: No results for input(s): GLUCAP in the last 168 hours.  Radiological Exams on Admission: CT Angio Chest PE W and/or Wo Contrast  Result Date: 04/27/2020 CLINICAL DATA:  39 year old male with acute chest pain and shortness of breath. Patient is currently on Eliquis for history of pulmonary emboli. EXAM: CT ANGIOGRAPHY CHEST WITH CONTRAST TECHNIQUE: Multidetector CT imaging of the chest was performed using the standard protocol during bolus administration of intravenous contrast. Multiplanar CT image reconstructions and MIPs were obtained to evaluate the vascular anatomy. CONTRAST:  20 OMNIPAQUE IOHEXOL 350 MG/ML SOLN COMPARISON:  06/24/2018 CT FINDINGS: Cardiovascular: This is a technically adequate study but motion artifact decreases sensitivity in several portions of the lungs. Multiple new bilateral segmental pulmonary emboli are identified, primarily within the LOWER lobes. UPPER limits normal heart size noted. There  is no CT evidence of RIGHT heart strain. No pericardial effusion or thoracic aortic aneurysm. Mediastinum/Nodes: No enlarged mediastinal, hilar, or axillary lymph nodes. Thyroid gland, trachea, and esophagus demonstrate no significant findings. Lungs/Pleura: Patchy/streaky opacity within the RIGHT LOWER lobe likely represents pulmonary embolus/infarct changes and atelectasis. Mild LEFT basilar atelectasis is noted. A trace RIGHT pleural effusion is present. No pulmonary mass or pneumothorax. Upper Abdomen: No acute abnormality Musculoskeletal: No acute or suspicious bony abnormalities. Review of the MIP images confirms the above findings. IMPRESSION: 1. Multiple new bilateral segmental pulmonary emboli, primarily within the LOWER lobes. No CT evidence of RIGHT heart strain. 2. Patchy/streaky opacity within the RIGHT LOWER lobe likely representing pulmonary embolus/infarct changes and atelectasis. 3. Trace RIGHT pleural effusion and mild LEFT basilar atelectasis. Critical Value/emergent results were called by telephone at the time of interpretation on 04/27/2020 at 7:58 am to provider Dr. 04/29/2020, who verbally acknowledged these  results. Electronically Signed   By: Harmon Pier M.D.   On: 04/27/2020 08:00    EKG: Independently reviewed. Sinus brady, no st changes  Time spent: 45 minutes spent in the coordination of this consult.  Teddy Spike DO [facility]  If 7PM-7AM, please contact night-coverage [email or web address] 04/27/2020, 9:11 AM

## 2020-04-27 NOTE — Progress Notes (Signed)
Echocardiogram 2D Echocardiogram has been performed.  Stephen Farmer 04/27/2020, 11:43 AM

## 2020-04-27 NOTE — Progress Notes (Addendum)
.   Transition of Care [facility]) - Emergency Department Mini Assessment   Patient Details  Name: Stephen Farmer MRN: [id number] Date of Birth: May 19, 1981  [facility]) CM/SW Contact:    Elliot Cousin, RN Phone Number: 04/27/2020, 11:30 AM   Clinical Narrative:   TOC CM spoke to pt and states he has a full bottle of Eliquis. He plans to schedule appt to see his PCP. Explained insurance will not cover 7 day supply of Eliquis. ED provider updated. Pt has used Eliquis 30 day free trial. Pt has a $10 copay card for Eliquis in his room. Reports Eliquis is affordable with his insurance.   ED Mini Assessment: What brought you to the Emergency Department? : chest pain  Barriers to Discharge: No Barriers Identified     Means of departure: Car  Interventions which prevented an admission or readmission: Medication Review    Patient Contact and Communications  Patient states their goals for this hospitalization and ongoing recovery are:: patient plans to schedule appt with PCP      Admission diagnosis:  Difficulty Breathing Patient Active Problem List   Diagnosis Date Noted  . Screen for STD (sexually transmitted disease) 11/27/2019  . History of pulmonary embolism 11/27/2019  . 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 Pulmonary embolism (HCC) 06/24/2018  . Chest pain 06/23/2018   PCP:  Mliss Sax, MD Pharmacy:   [facility] [address] [place] - [address] [place] [address] Phone: [phone] Fax: [phone]

## 2020-04-27 NOTE — ED Provider Notes (Signed)
[facility] Provider Note: Lowella DellJ. Lane Gaither Biehn, MD, FACEP  CSN: [id number] MRN: [id number] ARRIVAL: 04/27/20 at 0346 ROOM: WA17/WA17   CHIEF COMPLAINT  Shortness of Breath   HISTORY OF PRESENT ILLNESS  04/27/20 4:18 AM Stephen Farmer is a 39 y.o. male with a pulmonary embolism history who has been noncompliant with his Eliquis.  He is here with shortness of breath and back pain that began this morning.  He rates the pain is a 10 out of 10, sharp in nature, and located in his right back.  It is worse with deep breathing.  He was noted to have a temperature of 100.4.   Past Medical History:  Diagnosis Date  . Pulmonary emboli (HCC)     History reviewed. No pertinent surgical history.  Family History  Problem Relation Age of Onset  . Hypertension Mother   . Sleep apnea Father   . Stroke Neg Hx     Social History   Tobacco Use  . Smoking status: Never Smoker  . Smokeless tobacco: Never Used  Vaping Use  . Vaping Use: Never used  Substance Use Topics  . Alcohol use: Yes    Alcohol/week: 1.0 standard drink    Types: 1 Standard drinks or equivalent per week    Comment: social  . Drug use: No    Prior to Admission medications   Medication Sig Start Date End Date Taking? Authorizing Provider  Ascorbic Acid (VITAMIN C ADULT GUMMIES PO) Take 1 tablet by mouth daily.   Yes [provider]  omeprazole (PRILOSEC) 20 MG capsule Take 20 mg by mouth daily as needed (heartburn).    Yes [provider]  apixaban (ELIQUIS) 5 MG TABS tablet Take 2 tablets daily for 7 days then resume your current 5 mg twice daily regimen 04/27/20   Ayyub Krall, Jonny RuizJohn, MD  apixaban (ELIQUIS) 5 MG TABS tablet Take 1 tablet (5 mg total) by mouth 2 (two) times daily. 04/27/20   Melene PlanFloyd, Dan, DO  ketorolac (TORADOL) 10 MG tablet Take 1 tablet (10 mg total) by mouth every 6 (six) hours as needed. 04/27/20   Melene PlanFloyd, Dan, DO    Allergies Patient has no known allergies.   REVIEW OF  SYSTEMS  Negative except as noted here or in the History of Present Illness.   PHYSICAL EXAMINATION  Initial Vital Signs Blood pressure (!) 148/91, pulse 68, temperature 99.2 F (37.3 C), temperature source Oral, resp. rate 19, height 5\' 8"  (1.727 m), weight 81.6 kg, SpO2 100 %.  Examination General: Well-developed, well-nourished male in no acute distress; appearance consistent with age of record HENT: normocephalic; atraumatic Eyes: pupils equal, round and reactive to light; extraocular muscles intact Neck: supple Heart: regular rate and rhythm Lungs: clear to auscultation bilaterally Abdomen: soft; nondistended; nontender; bowel sounds present Extremities: No deformity; full range of motion; pulses normal; no edema Neurologic: Awake, alert and oriented; motor function intact in all extremities and symmetric; no facial droop Skin: Warm and dry Psychiatric: Normal mood and affect   RESULTS  Summary of this visit's results, reviewed and interpreted by myself:   EKG Interpretation  Date/Time:  Saturday April 27 2020 04:10:49 EST Ventricular Rate:  58 PR Interval:    QRS Duration: 98 QT Interval:  401 QTC Calculation: 394 R Axis:   32 Text Interpretation: Sinus rhythm RSR' in V1 or V2, probably normal variant Probable left ventricular hypertrophy ST elev, probable normal early repol pattern No significant change was found Confirmed by Tatisha Cerino (  [id number]) on 04/27/2020 6:05:46 AM      Laboratory Studies: Results for orders placed or performed during the hospital encounter of 04/27/20 (from the past 24 hour(s))  Basic metabolic panel     Status: Abnormal   Collection Time: 04/27/20  4:09 AM  Result Value Ref Range   Sodium 135 135 - 145 mmol/L   Potassium 3.5 3.5 - 5.1 mmol/L   Chloride 99 98 - 111 mmol/L   CO2 25 22 - 32 mmol/L   Glucose, Bld 94 70 - 99 mg/dL   BUN 14 6 - 20 mg/dL   Creatinine, Ser 1.76 0.61 - 1.24 mg/dL   Calcium 8.7 (L) 8.9 - 10.3 mg/dL   GFR,  Estimated >16 >07 mL/min   Anion gap 11 5 - 15  CBC     Status: Abnormal   Collection Time: 04/27/20  4:09 AM  Result Value Ref Range   WBC 7.1 4.0 - 10.5 K/uL   RBC 4.52 4.22 - 5.81 MIL/uL   Hemoglobin 15.4 13.0 - 17.0 g/dL   HCT 37.1 39 - 52 %   MCV 98.5 80.0 - 100.0 fL   MCH 34.1 (H) 26.0 - 34.0 pg   MCHC 34.6 30.0 - 36.0 g/dL   RDW 06.2 69.4 - 85.4 %   Platelets 228 150 - 400 K/uL   nRBC 0.0 0.0 - 0.2 %  Troponin I (High Sensitivity)     Status: Abnormal   Collection Time: 04/27/20  4:09 AM  Result Value Ref Range   Troponin I (High Sensitivity) 68 (H) <18 ng/L  Resp Panel by RT-PCR (Flu A&B, Covid) Nasopharyngeal Swab     Status: None   Collection Time: 04/27/20  4:34 AM   Specimen: Nasopharyngeal Swab; Nasopharyngeal(NP) swabs in vial transport medium  Result Value Ref Range   SARS Coronavirus 2 by RT PCR NEGATIVE NEGATIVE   Influenza A by PCR NEGATIVE NEGATIVE   Influenza B by PCR NEGATIVE NEGATIVE  Troponin I (High Sensitivity)     Status: Abnormal   Collection Time: 04/27/20  8:50 AM  Result Value Ref Range   Troponin I (High Sensitivity) 62 (H) <18 ng/L   Imaging Studies: CT Angio Chest PE W and/or Wo Contrast  Result Date: 04/27/2020 CLINICAL DATA:  39 year old male with acute chest pain and shortness of breath. Patient is currently on Eliquis for history of pulmonary emboli. EXAM: CT ANGIOGRAPHY CHEST WITH CONTRAST TECHNIQUE: Multidetector CT imaging of the chest was performed using the standard protocol during bolus administration of intravenous contrast. Multiplanar CT image reconstructions and MIPs were obtained to evaluate the vascular anatomy. CONTRAST:  OMNIPAQUE IOHEXOL 350 MG/ML SOLN COMPARISON:  06/24/2018 CT FINDINGS: Cardiovascular: This is a technically adequate study but motion artifact decreases sensitivity in several portions of the lungs. Multiple new bilateral segmental pulmonary emboli are identified, primarily within the LOWER lobes. UPPER  limits normal heart size noted. There is no CT evidence of RIGHT heart strain. No pericardial effusion or thoracic aortic aneurysm. Mediastinum/Nodes: No enlarged mediastinal, hilar, or axillary lymph nodes. Thyroid gland, trachea, and esophagus demonstrate no significant findings. Lungs/Pleura: Patchy/streaky opacity within the RIGHT LOWER lobe likely represents pulmonary embolus/infarct changes and atelectasis. Mild LEFT basilar atelectasis is noted. A trace RIGHT pleural effusion is present. No pulmonary mass or pneumothorax. Upper Abdomen: No acute abnormality Musculoskeletal: No acute or suspicious bony abnormalities. Review of the MIP images confirms the above findings. IMPRESSION: 1. Multiple new bilateral segmental pulmonary emboli, primarily within the LOWER lobes.  No CT evidence of RIGHT heart strain. 2. Patchy/streaky opacity within the RIGHT LOWER lobe likely representing pulmonary embolus/infarct changes and atelectasis. 3. Trace RIGHT pleural effusion and mild LEFT basilar atelectasis. Critical Value/emergent results were called by telephone at the time of interpretation on 04/27/2020 at 7:58 am to provider Dr. Adela Lank, who verbally acknowledged these results. Electronically Signed   By: Harmon Pier M.D.   On: 04/27/2020 08:00   ECHOCARDIOGRAM COMPLETE  Result Date: 04/27/2020    ECHOCARDIOGRAM REPORT   Patient Name:   Stephen Farmer Date of Exam: 04/27/2020 Medical Rec #:  [id number]      Height:       68.0 in Accession #:    [id number]     Weight:       180.0 lb Date of Birth:  September 09, 1980     BSA:          1.954 m Patient Age:    38 years       BP:           118/88 mmHg Patient Gender: M              HR:           57 bpm. Exam Location:  Inpatient Procedure: 2D Echo, Color Doppler and Cardiac Doppler Indications:    I26.02 Pulmonary embolus  History:        Patient has prior history of Echocardiogram examinations, most                 recent 06/25/2018.  Sonographer:    Irving Burton Senior RDCS Referring  Phys: [id number] Teddy Spike IMPRESSIONS  1. Left ventricular ejection fraction, by estimation, is 60 to 65%. The left ventricle has normal function. The left ventricle has no regional wall motion abnormalities. Left ventricular diastolic parameters were normal.  2. Right ventricular systolic function is normal. The right ventricular size is normal.  3. The mitral valve is normal in structure. No evidence of mitral valve regurgitation. No evidence of mitral stenosis.  4. The aortic valve is normal in structure. Aortic valve regurgitation is not visualized. No aortic stenosis is present.  5. The inferior vena cava is normal in size with greater than 50% respiratory variability, suggesting right atrial pressure of 3 mmHg. FINDINGS  Left Ventricle: Left ventricular ejection fraction, by estimation, is 60 to 65%. The left ventricle has normal function. The left ventricle has no regional wall motion abnormalities. The left ventricular internal cavity size was normal in size. There is  no left ventricular hypertrophy. Left ventricular diastolic parameters were normal. Right Ventricle: The right ventricular size is normal. No increase in right ventricular wall thickness. Right ventricular systolic function is normal. Left Atrium: Left atrial size was normal in size. Right Atrium: Right atrial size was normal in size. Pericardium: There is no evidence of pericardial effusion. Mitral Valve: The mitral valve is normal in structure. No evidence of mitral valve regurgitation. No evidence of mitral valve stenosis. Tricuspid Valve: The tricuspid valve is normal in structure. Tricuspid valve regurgitation is not demonstrated. No evidence of tricuspid stenosis. Aortic Valve: The aortic valve is normal in structure. Aortic valve regurgitation is not visualized. No aortic stenosis is present. Pulmonic Valve: The pulmonic valve was normal in structure. Pulmonic valve regurgitation is trivial. No evidence of pulmonic stenosis. Aorta:  The aortic root is normal in size and structure. Venous: The inferior vena cava is normal in size with greater than 50% respiratory variability, suggesting right atrial  pressure of 3 mmHg. IAS/Shunts: No atrial level shunt detected by color flow Doppler.  LEFT VENTRICLE PLAX 2D LVIDd:         4.50 cm  Diastology LVIDs:         2.70 cm  LV e' medial:    9.57 cm/s LV PW:         0.90 cm  LV E/e' medial:  7.0 LV IVS:        0.70 cm  LV e' lateral:   8.27 cm/s LVOT diam:     2.10 cm  LV E/e' lateral: 8.1 LV SV:         87 LV SV Index:   44 LVOT Area:     3.46 cm  RIGHT VENTRICLE RV S prime:     12.80 cm/s TAPSE (M-mode): 1.9 cm LEFT ATRIUM             Index       RIGHT ATRIUM           Index LA diam:        3.30 cm 1.69 cm/m  RA Area:     16.70 cm LA Vol (A2C):   48.3 ml 24.72 ml/m RA Volume:   42.90 ml  21.95 ml/m LA Vol (A4C):   45.0 ml 23.03 ml/m LA Biplane Vol: 50.4 ml 25.79 ml/m  AORTIC VALVE LVOT Vmax:   112.00 cm/s LVOT Vmean:  82.100 cm/s LVOT VTI:    0.251 m  AORTA Ao Root diam: 3.10 cm Ao Asc diam:  3.00 cm MITRAL VALVE MV Area (PHT): 2.83 cm    SHUNTS MV Decel Time: 268 msec    Systemic VTI:  0.25 m MV E velocity: 66.80 cm/s  Systemic Diam: 2.10 cm MV A velocity: 39.00 cm/s MV E/A ratio:  1.71 Donato Schultz MD Electronically signed by Donato Schultz MD Signature Date/Time: 04/27/2020/11:40:26 AM    Final     ED COURSE and MDM  Nursing notes, initial and subsequent vitals signs, including pulse oximetry, reviewed and interpreted by myself.  Vitals:   04/27/20 1145 04/27/20 1200 04/27/20 1215 04/27/20 1230  BP:  (!) 139/115  137/83  Pulse: 65 (!) 54 (!) 54 (!) 52  Resp: (!) 21 (!) 21 18 17   Temp:      TempSrc:      SpO2: 98% 99% 99% 94%  Weight:      Height:       Medications  ketorolac (TORADOL) 15 MG/ML injection 15 mg (15 mg Intravenous Given 04/27/20 0436)  iohexol (OMNIPAQUE) 350 MG/ML injection 100 mL (100 mLs Intravenous Contrast Given 04/27/20 0608)  acetaminophen (TYLENOL) tablet  1,000 mg (1,000 mg Oral Given 04/27/20 0757)  oxyCODONE (Oxy IR/ROXICODONE) immediate release tablet 5 mg (5 mg Oral Given 04/27/20 0757)   Will re-start on Eliquis 10mg  BID x 7 days then switch back to Eliquis 5mg  BID. Patient was advised of importance of being compliance with anticoagulation.   PROCEDURES  Procedures   ED DIAGNOSES     ICD-10-CM   1. Multiple subsegmental pulmonary emboli without acute cor pulmonale (HCC)  I26.94   2. History of pulmonary embolism  Z86.711 apixaban (ELIQUIS) 5 MG TABS tablet       Tommy Goostree, MD 04/27/20 2243

## 2020-04-27 NOTE — Progress Notes (Addendum)
Lower extremity venous has been completed.   Lower extremities are negative for DVT.   Blanch Media 04/27/2020 12:50 PM

## 2020-04-27 NOTE — ED Triage Notes (Signed)
Patient here from home reporting SOB, chest pain, and back pain that started this morning. Hx of PE, was taking Eliquis but stopped.

## 2020-04-29 ENCOUNTER — Other Ambulatory Visit: Payer: Self-pay

## 2020-05-01 ENCOUNTER — Telehealth: Payer: Self-pay | Admitting: Family Medicine

## 2020-05-01 ENCOUNTER — Observation Stay (HOSPITAL_COMMUNITY)
Admission: EM | Admit: 2020-05-01 | Discharge: 2020-05-02 | Disposition: A | Discharge status: Home / Self Care | Payer: 59 | Attending: Internal Medicine | Admitting: Internal Medicine

## 2020-05-01 ENCOUNTER — Emergency Department (HOSPITAL_COMMUNITY): Payer: 59

## 2020-05-01 ENCOUNTER — Encounter: Payer: Self-pay | Admitting: Family

## 2020-05-01 ENCOUNTER — Ambulatory Visit (INDEPENDENT_AMBULATORY_CARE_PROVIDER_SITE_OTHER): Payer: 59 | Admitting: Family

## 2020-05-01 ENCOUNTER — Other Ambulatory Visit: Payer: Self-pay

## 2020-05-01 ENCOUNTER — Encounter (HOSPITAL_COMMUNITY): Payer: Self-pay

## 2020-05-01 VITALS — BP 122/80 | HR 72 | Temp 97.5°F | Ht 68.0 in | Wt 187.8 lb

## 2020-05-01 DIAGNOSIS — R079 Chest pain, unspecified: Secondary | ICD-10-CM | POA: Diagnosis present

## 2020-05-01 DIAGNOSIS — R0902 Hypoxemia: Secondary | ICD-10-CM | POA: Diagnosis not present

## 2020-05-01 DIAGNOSIS — I2699 Other pulmonary embolism without acute cor pulmonale: Principal | ICD-10-CM | POA: Diagnosis present

## 2020-05-01 DIAGNOSIS — R778 Other specified abnormalities of plasma proteins: Secondary | ICD-10-CM

## 2020-05-01 DIAGNOSIS — R071 Chest pain on breathing: Secondary | ICD-10-CM

## 2020-05-01 DIAGNOSIS — R7989 Other specified abnormal findings of blood chemistry: Secondary | ICD-10-CM

## 2020-05-01 DIAGNOSIS — R042 Hemoptysis: Secondary | ICD-10-CM | POA: Diagnosis not present

## 2020-05-01 LAB — CBC
HCT: 45.6 % (ref 39.0–52.0)
Hemoglobin: 15.8 g/dL (ref 13.0–17.0)
MCH: 33.9 pg (ref 26.0–34.0)
MCHC: 34.6 g/dL (ref 30.0–36.0)
MCV: 97.9 fL (ref 80.0–100.0)
Platelets: 282 10*3/uL (ref 150–400)
RBC: 4.66 MIL/uL (ref 4.22–5.81)
RDW: 12.5 % (ref 11.5–15.5)
WBC: 6 10*3/uL (ref 4.0–10.5)
nRBC: 0 % (ref 0.0–0.2)

## 2020-05-01 LAB — BASIC METABOLIC PANEL
Anion gap: 12 (ref 5–15)
BUN: 11 mg/dL (ref 6–20)
CO2: 23 mmol/L (ref 22–32)
Calcium: 9.2 mg/dL (ref 8.9–10.3)
Chloride: 102 mmol/L (ref 98–111)
Creatinine, Ser: 1.32 mg/dL — ABNORMAL HIGH (ref 0.61–1.24)
GFR, Estimated: >60 mL/min (ref >60)
Glucose, Bld: 90 mg/dL (ref 70–99)
Potassium: 3.9 mmol/L (ref 3.5–5.1)
Sodium: 137 mmol/L (ref 135–145)

## 2020-05-01 LAB — TROPONIN I (HIGH SENSITIVITY)
Troponin I (High Sensitivity): 52 ng/L — ABNORMAL HIGH (ref <18)
Troponin I (High Sensitivity): 49 ng/L — ABNORMAL HIGH (ref <18)

## 2020-05-01 MED ORDER — IOHEXOL 350 MG/ML SOLN
75.0000 mL | Freq: Once | INTRAVENOUS | Status: AC | PRN
  Administered 2020-05-01: 75 mL via INTRAVENOUS

## 2020-05-01 NOTE — ED Triage Notes (Signed)
Pt arrived via walk in, c/o worsening chest pain and SOB. Seen 11/20 for same, dx with bilat PE's. Hx of same. Started on blood thinners.

## 2020-05-01 NOTE — ED Notes (Signed)
Gold and Blue top tubes sent to lab.

## 2020-05-01 NOTE — ED Provider Notes (Signed)
[facility]-EMERGENCY DEPT Provider Note   CSN: [id number] Arrival date & time: 05/01/20  1721     History Chief Complaint  Patient presents with  . Chest Pain    Stephen Farmer is a 39 y.o. male.  HPI Patient presents with chest pain shortness of breath.  Has known pulmonary embolisms that were diagnosed 4 days ago on CTA.  Bilateral PEs with some infarct.  States breathing had improved since Saturday vertically on Monday but then worsened again little bit.  States it waxes and wanes somewhat.  States he has had a little bit of hemoptysis.  States he called his primary care doctor and was told to come into the ER for evaluation.  Reviewing notes it states that they called hematology/oncology and talk to Raliegh Ip, although Raliegh Ip is not a hematologist.  States overall he does feel better than he did when he was diagnosed.  No swelling in his legs.  He was started on Eliquis and has had doses from Saturday up until now being Wednesday.  Does have some dull chest pain.  Time feels more short of breath than others. He had previously had a pulmonary embolism almost 2 years ago.  Had been on Eliquis for 6 months.  Reportedly had a negative hypercoagulable work-up at that time.    Past Medical History:  Diagnosis Date  . Pulmonary emboli [facility])     Patient Active Problem List   Diagnosis Date Noted  . Screen for STD (sexually transmitted disease) 11/27/2019  . History of pulmonary embolism 11/27/2019  . 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 Pulmonary embolism (HCC) 06/24/2018  . Chest pain 06/23/2018    History reviewed. No pertinent surgical history.     Family History  Problem Relation Age of Onset  . Hypertension Mother   . Sleep apnea Father   . Stroke Neg Hx     Social History   Tobacco Use  . Smoking status: Never Smoker  . Smokeless tobacco: Never Used  Vaping Use  . Vaping Use:  Never used  Substance Use Topics  . Alcohol use: Yes    Alcohol/week: 1.0 standard drink    Types: 1 Standard drinks or equivalent per week    Comment: social  . Drug use: No    Home Medications Prior to Admission medications   Medication Sig Start Date End Date Taking? Authorizing Provider  acetaminophen (TYLENOL) 500 MG tablet Take 1,000 mg by mouth every 6 (six) hours as needed for moderate pain.   Yes [provider]  apixaban (ELIQUIS) 5 MG TABS tablet Take 2 tablets daily for 7 days then resume your current 5 mg twice daily regimen Patient taking differently: Take 5 mg by mouth See admin instructions. Take 2 tablets in the morning and 2 tablets at night until 05/04/20 and after that 1 tablet 2 times daily. 04/27/20  Yes Molpus, John, MD  Ascorbic Acid (VITAMIN C ADULT GUMMIES PO) Take 1 tablet by mouth daily.   Yes [provider]  Hyprom-Naphaz-Polysorb-Zn Sulf (CLEAR EYES COMPLETE OP) Place 1 drop into both eyes daily as needed (dry eyes).   Yes [provider]  ketorolac (TORADOL) 10 MG tablet Take 1 tablet (10 mg total) by mouth every 6 (six) hours as needed. Patient taking differently: Take 10 mg by mouth every 6 (six) hours as needed for moderate pain.  04/27/20  Yes Melene Plan, DO  omeprazole (PRILOSEC) 20 MG  capsule Take 20 mg by mouth daily as needed (heartburn).    Yes [provider]  apixaban (ELIQUIS) 5 MG TABS tablet Take 1 tablet (5 mg total) by mouth 2 (two) times daily. Patient not taking: Reported on 05/01/2020 04/27/20   Melene Plan, DO    Allergies    Patient has no known allergies.  Review of Systems   Review of Systems  Constitutional: Negative for appetite change, fatigue and fever.  HENT: Negative for dental problem.   Respiratory: Positive for shortness of breath.   Cardiovascular: Positive for chest pain.  Gastrointestinal: Negative for abdominal pain.  Genitourinary: Negative for flank pain.  Musculoskeletal:  Negative for back pain.  Skin: Negative for rash.  Neurological: Negative for weakness.    Physical Exam Updated Vital Signs BP 122/80   Pulse 67   Temp 97.9 F (36.6 C) (Oral)   Resp (!) 22   SpO2 100%   Physical Exam Vitals and nursing note reviewed.  HENT:     Head: Normocephalic.  Eyes:     Pupils: Pupils are equal, round, and reactive to light.  Cardiovascular:     Rate and Rhythm: Normal rate and regular rhythm.  Pulmonary:     Effort: Pulmonary effort is normal. No tachypnea.     Breath sounds: No wheezing, rhonchi or rales.  Chest:     Chest wall: No tenderness.  Abdominal:     Tenderness: There is no abdominal tenderness.  Musculoskeletal:     Right lower leg: No edema.     Left lower leg: No edema.  Skin:    General: Skin is warm.     Capillary Refill: Capillary refill takes less than 2 seconds.  Neurological:     Mental Status: He is alert and oriented to person, place, and time.     ED Results / Procedures / Treatments   Labs (all labs ordered are listed, but only abnormal results are displayed) Labs Reviewed  BASIC METABOLIC PANEL - Abnormal; Notable for the following components:      Result Value   Creatinine, Ser 1.32 (*)    All other components within normal limits  TROPONIN I (HIGH SENSITIVITY) - Abnormal; Notable for the following components:   Troponin I (High Sensitivity) 52 (*)    All other components within normal limits  TROPONIN I (HIGH SENSITIVITY) - Abnormal; Notable for the following components:   Troponin I (High Sensitivity) 49 (*)    All other components within normal limits  CBC    EKG EKG Interpretation  Date/Time:  Wednesday May 01 2020 17:31:57 EST Ventricular Rate:  75 PR Interval:    QRS Duration: 88 QT Interval:  346 QTC Calculation: 387 R Axis:   15 Text Interpretation: Sinus rhythm RSR' in V1 or V2, probably normal variant ST elev, probable normal early repol pattern 12 Lead; Mason-Likar No significant  change since last tracing Confirmed by Benjiman Core [phone]) on 05/01/2020 6:04:49 PM   Radiology DG Chest 2 View  Result Date: 05/01/2020 CLINICAL DATA:  Shortness of breath EXAM: CHEST - 2 VIEW COMPARISON:  06/23/2018, CT chest 04/27/2020 FINDINGS: Small right pleural effusion. Patchy peripheral right lower lobe opacity, likely corresponding to airspace disease on CT and suspect for pulmonary infarction in the setting of PE. Normal cardiomediastinal silhouette. No pneumothorax IMPRESSION: Small right pleural effusion with patchy peripheral airspace disease at the right base, likely corresponding to suspected pulmonary infarct on CT Electronically Signed   By: Adrian Prows.D.  On: 05/01/2020 19:15   CT Angio Chest PE W and/or Wo Contrast  Result Date: 05/01/2020 CLINICAL DATA:  Worsening chest pain and shortness of breath. Recent diagnosis of bilateral pulmonary embolism on treatment. EXAM: CT ANGIOGRAPHY CHEST WITH CONTRAST TECHNIQUE: Multidetector CT imaging of the chest was performed using the standard protocol during bolus administration of intravenous contrast. Multiplanar CT image reconstructions and MIPs were obtained to evaluate the vascular anatomy. CONTRAST:  65mL OMNIPAQUE IOHEXOL 350 MG/ML SOLN COMPARISON:  04/27/2020 FINDINGS: Cardiovascular: Moderately good opacification of the central and segmental pulmonary arteries. Although small peripheral filling defects are again demonstrated, there is significant decrease in the visualized clot burden since the previous study consistent with interval response to therapy. No new emboli are demonstrated. Normal heart size. No pericardial effusions. Normal caliber thoracic aorta. Great vessel origins are patent. Mediastinum/Nodes: Scattered mediastinal lymph nodes are not pathologically enlarged. Esophagus is decompressed. Lungs/Pleura: Small but increasing right pleural effusion and basilar atelectasis. This could represent pneumonia or  compressive atelectasis. No pneumothorax. Airways are patent. Upper Abdomen: No acute abnormalities are demonstrated in the visualized upper abdomen. Musculoskeletal: No chest wall abnormality. No acute or significant osseous findings. Review of the MIP images confirms the above findings. IMPRESSION: 1. There is significant decrease in the visualized clot burden since the previous study consistent with interval response to therapy. No new emboli are demonstrated. 2. Small but increasing right pleural effusion and basilar atelectasis. This could represent pneumonia or compressive atelectasis. Electronically Signed   By: Burman Nieves M.D.   On: 05/01/2020 23:29    Procedures Procedures (including critical care time)  Medications Ordered in ED Medications  iohexol (OMNIPAQUE) 350 MG/ML injection 75 mL (75 mLs Intravenous Contrast Given 05/01/20 2317)    ED Course  I have reviewed the triage vital signs and the nursing notes.  Pertinent labs & imaging results that were available during my care of the patient were reviewed by me and considered in my medical decision making (see chart for details).    MDM Rules/Calculators/A&P                          Patient presents with shortness of breath and chest pain.  Recent pulmonary embolism.  On Eliquis.  For the last few days.  Has had small amount of hemoptysis 2.  However states when he moves around or lays down pain gets severe.  X-ray stable.  Lab work reassuring although creatinine now mildly increased.  Troponin stable x2 and slightly decreased from prior. However patient ambulated and sats went to 70%.  CT scan done and actually shows improved clot burden.  However with hypoxia will discuss with hospitalist for admission. Final Clinical Impression(s) / ED Diagnoses Final diagnoses:  Acute pulmonary embolism, unspecified pulmonary embolism type, unspecified whether acute cor pulmonale present [facility])  Hypoxia    Rx / DC Orders ED Discharge  Orders    None       Benjiman Core, MD 05/01/20 2339

## 2020-05-01 NOTE — Progress Notes (Signed)
Acute Office Visit  Subjective:    Patient ID: Stephen Farmer, male    DOB: 1981/05/13, 39 y.o.   MRN: [id number]  Chief Complaint  Patient presents with  . Follow-up    hospital follow up, seen for pulmonary embolism. Patient feels like he have not improved much. Coughing up little blood today.     HPI Patient is in today with c/o worsening right sided chest pain and bloody sputum. He was seen in the Emergency Department 3 days ago and diagnosed with bilateral PEs, started on Eliquis and given Toradol for pain/inflammation. Pain at that time was 10/10.  Patient reports Monday and Tuesday his pain was improving to abut 2/10. Today his pain is 6-8/10, worse when taking a deep breath. He is also concerned that he saw specks of blood in his sputum 2 days ago and today his sputum is dark red. Has a history of GERD but has not had any heartburn or indigestion recently.  Has a history of PEs, 2020.   Past Medical History:  Diagnosis Date  . Pulmonary emboli (HCC)     History reviewed. No pertinent surgical history.  Family History  Problem Relation Age of Onset  . Hypertension Mother   . Sleep apnea Father   . Stroke Neg Hx     Social History   Socioeconomic History  . Marital status: Single    Spouse name: Not on file  . Number of children: Not on file  . Years of education: Not on file  . Highest education level: Not on file  Occupational History  . Not on file  Tobacco Use  . Smoking status: Never Smoker  . Smokeless tobacco: Never Used  Vaping Use  . Vaping Use: Never used  Substance and Sexual Activity  . Alcohol use: Yes    Alcohol/week: 1.0 standard drink    Types: 1 Standard drinks or equivalent per week    Comment: social  . Drug use: No  . Sexual activity: Yes  Other Topics Concern  . Not on file  Social History Narrative  . Not on file   Social Determinants of Health   Financial Resource Strain:   . Difficulty of Paying Living Expenses: Not on file    Food Insecurity:   . Worried About Programme researcher, broadcasting/film/video in the Last Year: Not on file  . Ran Out of Food in the Last Year: Not on file  Transportation Needs:   . Lack of Transportation (Medical): Not on file  . Lack of Transportation (Non-Medical): Not on file  Physical Activity:   . Days of Exercise per Week: Not on file  . Minutes of Exercise per Session: Not on file  Stress:   . Feeling of Stress : Not on file  Social Connections:   . Frequency of Communication with Friends and Family: Not on file  . Frequency of Social Gatherings with Friends and Family: Not on file  . Attends Religious Services: Not on file  . Active Member of Clubs or Organizations: Not on file  . Attends Banker Meetings: Not on file  . Marital Status: Not on file  Intimate Partner Violence:   . Fear of Current or Ex-Partner: Not on file  . Emotionally Abused: Not on file  . Physically Abused: Not on file  . Sexually Abused: Not on file    Outpatient Medications Prior to Visit  Medication Sig Dispense Refill  . apixaban (ELIQUIS) 5 MG TABS tablet Take  2 tablets daily for 7 days then resume your current 5 mg twice daily regimen 14 tablet 0  . apixaban (ELIQUIS) 5 MG TABS tablet Take 1 tablet (5 mg total) by mouth 2 (two) times daily. 60 tablet 0  . Ascorbic Acid (VITAMIN C ADULT GUMMIES PO) Take 1 tablet by mouth daily.    Marland Kitchen ketorolac (TORADOL) 10 MG tablet Take 1 tablet (10 mg total) by mouth every 6 (six) hours as needed. 20 tablet 0  . omeprazole (PRILOSEC) 20 MG capsule Take 20 mg by mouth daily as needed (heartburn).      No facility-administered medications prior to visit.    No Known Allergies  Review of Systems  Respiratory: Positive for cough. Negative for chest tightness and shortness of breath.        Blood in sputum when he coughs  Cardiovascular: Positive for chest pain. Negative for palpitations and leg swelling.  Gastrointestinal: Negative.  Negative for blood in stool.   Endocrine: Negative.   Genitourinary: Negative.  Negative for hematuria.  Musculoskeletal: Negative.   Skin: Negative.   Allergic/Immunologic: Negative.   Neurological: Negative.  Negative for dizziness and light-headedness.  Hematological: Negative.  Does not bruise/bleed easily.  Psychiatric/Behavioral: Negative.        Objective:    Physical Exam Constitutional:      Appearance: Normal appearance. He is normal weight.  Cardiovascular:     Pulses: Normal pulses.     Heart sounds: Normal heart sounds.  Pulmonary:     Effort: Pulmonary effort is normal. No respiratory distress.     Breath sounds: Normal breath sounds. No wheezing, rhonchi or rales.  Abdominal:     General: Abdomen is flat. Bowel sounds are normal. There is no distension.  Musculoskeletal:        General: No swelling or deformity. Normal range of motion.     Cervical back: Normal range of motion and neck supple.  Skin:    General: Skin is warm and dry.  Neurological:     General: No focal deficit present.     Mental Status: He is alert. Mental status is at baseline.  Psychiatric:        Mood and Affect: Mood normal.        Behavior: Behavior normal.     BP 122/80   Pulse 72   Temp (!) 97.5 F (36.4 C) (Tympanic)   Ht 5\' 8"  (1.727 m)   Wt 187 lb 12.8 oz (85.2 kg)   SpO2 97%   BMI 28.55 kg/m  Wt Readings from Last 3 Encounters:  05/01/20 187 lb 12.8 oz (85.2 kg)  04/27/20 180 lb (81.6 kg)  11/27/19 201 lb (91.2 kg)    Health Maintenance Due  Topic Date Due  . TETANUS/TDAP  Never done  . INFLUENZA VACCINE  Never done    There are no preventive care reminders to display for this patient.   No results found for: TSH Lab Results  Component Value Date   WBC 7.1 04/27/2020   HGB 15.4 04/27/2020   HCT 44.5 04/27/2020   MCV 98.5 04/27/2020   PLT 228 04/27/2020   Lab Results  Component Value Date   NA 135 04/27/2020   K 3.5 04/27/2020   CO2 25 04/27/2020   GLUCOSE 94 04/27/2020   BUN  14 04/27/2020   CREATININE 1.11 04/27/2020   BILITOT 0.5 11/27/2019   ALKPHOS 61 11/27/2019   AST 32 11/27/2019   ALT 29 11/27/2019   PROT 7.8  11/27/2019   ALBUMIN 4.7 11/27/2019   CALCIUM 8.7 (L) 04/27/2020   ANIONGAP 11 04/27/2020   GFR 97.54 11/27/2019   Lab Results  Component Value Date   CHOL 311 (H) 11/27/2019   Lab Results  Component Value Date   HDL 61.50 11/27/2019   Lab Results  Component Value Date   LDLCALC 222 (H) 11/27/2019   Lab Results  Component Value Date   TRIG 140.0 11/27/2019   Lab Results  Component Value Date   CHOLHDL 5 11/27/2019   No results found for: HGBA1C     Assessment & Plan:   Problem List Items Addressed This Visit    Chest pain   Bilateral pulmonary embolism (HCC) - Primary    Other Visit Diagnoses    Bloody sputum         Called [facility], consulted with Raliegh Ip, NP who suggested we send Mr. Difrancesco to the ED for additional work-up. Patient verbalized understanding and will report to [facility].  Report called to Patty at the ED  Will follow-up pending discharge  No orders of the defined types were placed in this encounter.    Eulis Foster, FNP

## 2020-05-01 NOTE — Telephone Encounter (Signed)
Pt called and said hes feeling worse and that he is coughing up blood so I transferred him to nurse triage

## 2020-05-01 NOTE — ED Notes (Signed)
Pt. Ambulated to restroom oxygen dropped down to 70. Pt felt a little anxious.

## 2020-05-02 DIAGNOSIS — I2699 Other pulmonary embolism without acute cor pulmonale: Secondary | ICD-10-CM

## 2020-05-02 DIAGNOSIS — R778 Other specified abnormalities of plasma proteins: Secondary | ICD-10-CM

## 2020-05-02 LAB — BLOOD GAS, ARTERIAL
Acid-Base Excess: 0.2 mmol/L (ref 0.0–2.0)
Bicarbonate: 23.7 mmol/L (ref 20.0–28.0)
O2 Saturation: 98.9 %
Patient temperature: 98.6
pCO2 arterial: 36.9 mmHg (ref 32.0–48.0)
pH, Arterial: 7.425 (ref 7.350–7.450)
pO2, Arterial: 117 mmHg — ABNORMAL HIGH (ref 83.0–108.0)

## 2020-05-02 LAB — PROCALCITONIN: Procalcitonin: <0.1 ng/mL

## 2020-05-02 LAB — ANTITHROMBIN III: AntiThromb III Func: 106 % (ref 75–120)

## 2020-05-02 MED ORDER — APIXABAN 5 MG PO TABS
10.0000 mg | ORAL_TABLET | Freq: Two times a day (BID) | ORAL | Status: DC
  Administered 2020-05-02: 10 mg via ORAL
  Filled 2020-05-02: qty 2

## 2020-05-02 MED ORDER — ACETAMINOPHEN-CODEINE #3 300-30 MG PO TABS
1.0000 | ORAL_TABLET | Freq: Four times a day (QID) | ORAL | 0 refills | Status: AC | PRN
Start: 2020-05-02 — End: 2020-05-07

## 2020-05-02 MED ORDER — OXYCODONE HCL 5 MG PO TABS
5.0000 mg | ORAL_TABLET | ORAL | Status: DC | PRN
  Administered 2020-05-02: 5 mg via ORAL
  Filled 2020-05-02: qty 1

## 2020-05-02 MED ORDER — ACETAMINOPHEN-CODEINE #3 300-30 MG PO TABS
1.0000 | ORAL_TABLET | Freq: Four times a day (QID) | ORAL | 0 refills | Status: DC | PRN
Start: 2020-05-02 — End: 2020-05-02

## 2020-05-02 MED ORDER — ACETAMINOPHEN 325 MG PO TABS: 650.0000 mg | ORAL_TABLET | Freq: Four times a day (QID) | ORAL | Status: DC | PRN

## 2020-05-02 MED ORDER — ACETAMINOPHEN 650 MG RE SUPP: 650.0000 mg | Freq: Four times a day (QID) | RECTAL | Status: DC | PRN

## 2020-05-02 MED ORDER — ACETAMINOPHEN 325 MG PO TABS: 650.0000 mg | ORAL_TABLET | Freq: Four times a day (QID) | ORAL | Status: AC | PRN

## 2020-05-02 MED ORDER — APIXABAN 5 MG PO TABS: 5.0000 mg | ORAL_TABLET | Freq: Two times a day (BID) | ORAL | Status: DC

## 2020-05-02 MED ORDER — KETOROLAC TROMETHAMINE 30 MG/ML IJ SOLN: 30.0000 mg | Freq: Four times a day (QID) | INTRAMUSCULAR | Status: DC | PRN

## 2020-05-02 NOTE — Plan of Care (Signed)
  Problem: Education: Goal: Knowledge of General Education information will improve Description: Including pain rating scale, medication(s)/side effects and non-pharmacologic comfort measures Outcome: Progressing   Problem: Clinical Measurements: Goal: Ability to maintain clinical measurements within normal limits will improve Outcome: Progressing Goal: Diagnostic test results will improve Outcome: Progressing Goal: Respiratory complications will improve Outcome: Progressing Goal: Cardiovascular complication will be avoided Outcome: Progressing   Problem: Activity: Goal: Risk for activity intolerance will decrease Outcome: Progressing   Problem: Coping: Goal: Level of anxiety will decrease Outcome: Progressing   Problem: Pain Managment: Goal: General experience of comfort will improve Outcome: Progressing   Problem: Safety: Goal: Ability to remain free from injury will improve Outcome: Progressing   

## 2020-05-02 NOTE — Discharge Summary (Signed)
Admitted early morning hours by nighttime hospitalist.  Patient is currently stabilized and he has completed his investigations and hospitalization purpose.  He is going home today.  See H&P done by early morning physician on admit.  Summary: 39 year old gentleman with second episode of unprovoked PE diagnosed on 11/20 discharged home on Eliquis and appropriately taking came back to the ER with more right-sided pleuritic chest pain and shortness of breath.  Hemodynamically stable in the ER.  On mobility, 1 reading of pulse ox was 67 so admitted for observation. Repeat CT scan with improved clot burden, right-sided basal effusion and atelectasis.  Impression: Pleurisy associated with pulmonary infarction from recent pulmonary embolism, appropriately treated with Eliquis with radiological and clinical improvement.  Plan: Ambulated in the hallway.  Heart rate remains a stable.  Patient is on room air, took 2 laps in the hallway with no drop in oxygen. Minimal discomfort remains on the right lower chest. Discharged home, continue Eliquis without interruption.  He will benefit with hypercoagulable work-up after at least 3 months of anticoagulation.  Already has heme-onc referral in place. He was instructed and educated to do incentive spirometry and deep breathing exercises that he will continue at home. Patient was prescribed a short course of Tylenol 3 to help with his pleuritic chest pain and deep breathing exercises.

## 2020-05-02 NOTE — H&P (Addendum)
History and Physical    Stephen Farmer QZR:[id number] DOB: 1981-05-05 DOA: 05/01/2020  PCP: Mliss Sax, MD Patient coming from: Home  Chief Complaint: Chest pain, shortness of breath  HPI: Stephen Farmer is a 39 y.o. male with medical history significant of PE diagnosed in January 2020 and treated with a course of Eliquis. He was seen in the ED on 04/27/2020 for right-sided chest pain and CT angiogram revealed multiple new bilateral segmental pulmonary emboli with right basilar pulmonary infarct; no CT evidence of right heart strain.  Patient was hemodynamically stable.  Echo was reassuring and without evidence of right heart strain.  Bilateral lower extremity Dopplers negative for DVT.  He was discharged with a short course of pain meds and an Eliquis starter pack with plan to follow-up with PCP in the next 2 to 4 weeks.  Patient is now presenting to the ED again with complaints of worsening chest pain and shortness of breath.  Patient states he initially felt better after he started taking Eliquis 10 mg twice daily, reports compliance.  However, for the past 1 day he is having increasing shortness of breath and right-sided sharp chest pain.  His PCP advised him to come into the ED to be evaluated.  Patient states when he was diagnosed with PE in January 2020 he was treated with a 68-month course of Eliquis at that time.  It was not clear clear to him why he keeps getting blood clots as he is quite physically active (soccer player).  Denies family history of blood clots.  Denies fevers, chills, or cough.  He has been vaccinated against Covid.  ED Course: Afebrile.  Slightly tachypneic.  Not tachycardic.  Not hypoxic at rest.  WBC 6.0, hemoglobin 15.8, hematocrit 45.6, platelet 282K.  Sodium 137, potassium 3.9, chloride 102, bicarb 23, BUN 11, creatinine 1.3, glucose 90.  High sensitive troponin mildly elevated but stable 52 >49.  Chest x-ray showing small right pleural effusion with patchy  peripheral airspace disease at the right base.  Patient's sats dropped to 70% on room air with ambulation in the ED.  Repeat CT angiogram was done and revealed significant decrease in the visualized clot burden since the previous study consistent with interval response to therapy.  No new emboli demonstrated.  Showing small but increasing right pleural effusion and basilar atelectasis, could represent pneumonia or compressive atelectasis.  Review of Systems:  All systems reviewed and apart from history of presenting illness, are negative.  Past Medical History:  Diagnosis Date  . Pulmonary emboli (HCC)     History reviewed. No pertinent surgical history.   reports that he has never smoked. He has never used smokeless tobacco. He reports current alcohol use of about 1.0 standard drink of alcohol per week. He reports that he does not use drugs.  No Known Allergies  Family History  Problem Relation Age of Onset  . Hypertension Mother   . Sleep apnea Father   . Stroke Neg Hx     Prior to Admission medications   Medication Sig Start Date End Date Taking? Authorizing Provider  acetaminophen (TYLENOL) 500 MG tablet Take 1,000 mg by mouth every 6 (six) hours as needed for moderate pain.   Yes [provider]  apixaban (ELIQUIS) 5 MG TABS tablet Take 2 tablets daily for 7 days then resume your current 5 mg twice daily regimen Patient taking differently: Take 5 mg by mouth See admin instructions. Take 2 tablets in the morning and 2  tablets at night until 05/04/20 and after that 1 tablet 2 times daily. 04/27/20  Yes Molpus, John, MD  Ascorbic Acid (VITAMIN C ADULT GUMMIES PO) Take 1 tablet by mouth daily.   Yes [provider]  Hyprom-Naphaz-Polysorb-Zn Sulf (CLEAR EYES COMPLETE OP) Place 1 drop into both eyes daily as needed (dry eyes).   Yes [provider]  ketorolac (TORADOL) 10 MG tablet Take 1 tablet (10 mg total) by mouth every 6 (six) hours as needed. Patient  taking differently: Take 10 mg by mouth every 6 (six) hours as needed for moderate pain.  04/27/20  Yes Melene Plan, DO  omeprazole (PRILOSEC) 20 MG capsule Take 20 mg by mouth daily as needed (heartburn).    Yes [provider]  apixaban (ELIQUIS) 5 MG TABS tablet Take 1 tablet (5 mg total) by mouth 2 (two) times daily. Patient not taking: Reported on 05/01/2020 04/27/20   Melene Plan, DO    Physical Exam: Vitals:   05/01/20 2100 05/01/20 2207 05/01/20 2330 05/02/20 0000  BP: 127/83 122/80 131/80 127/77  Pulse: (!) 57 67 63 69  Resp: (!) 34 (!) 22 (!) 21 20  Temp:      TempSrc:      SpO2: 100% 100% 100% 99%    Physical Exam Constitutional:      General: He is not in acute distress. HENT:     Head: Normocephalic and atraumatic.     Mouth/Throat:     Mouth: Mucous membranes are moist.     Pharynx: Oropharynx is clear.  Eyes:     Extraocular Movements: Extraocular movements intact.     Conjunctiva/sclera: Conjunctivae normal.  Cardiovascular:     Rate and Rhythm: Normal rate and regular rhythm.     Pulses: Normal pulses.  Pulmonary:     Effort: Pulmonary effort is normal. No respiratory distress.     Breath sounds: No wheezing or rales.     Comments: Decreased breath sounds at the right lung base Satting 100% on room air, no increased work of breathing Abdominal:     General: Bowel sounds are normal. There is no distension.     Palpations: Abdomen is soft.     Tenderness: There is no abdominal tenderness.  Musculoskeletal:        General: No swelling or tenderness.     Cervical back: Normal range of motion and neck supple.  Skin:    General: Skin is warm and dry.  Neurological:     General: No focal deficit present.     Mental Status: He is alert and oriented to person, place, and time.     Labs on Admission: I have personally reviewed following labs and imaging studies  CBC: Recent Labs  Lab 04/27/20 0409 05/01/20 1851  WBC 7.1 6.0  HGB 15.4 15.8  HCT  44.5 45.6  MCV 98.5 97.9  PLT 228 282   Basic Metabolic Panel: Recent Labs  Lab 04/27/20 0409 05/01/20 1851  NA 135 137  K 3.5 3.9  CL 99 102  CO2 25 23  GLUCOSE 94 90  BUN 14 11  CREATININE 1.11 1.32*  CALCIUM 8.7* 9.2   GFR: Estimated Creatinine Clearance: 80.6 mL/min (A) (by C-G formula based on SCr of 1.32 mg/dL (H)). Liver Function Tests: No results for input(s): AST, ALT, ALKPHOS, BILITOT, PROT, ALBUMIN in the last 168 hours. No results for input(s): LIPASE, AMYLASE in the last 168 hours. No results for input(s): AMMONIA in the last 168 hours. Coagulation  Profile: No results for input(s): INR, PROTIME in the last 168 hours. Cardiac Enzymes: No results for input(s): CKTOTAL, CKMB, CKMBINDEX, TROPONINI in the last 168 hours. BNP (last 3 results) No results for input(s): PROBNP in the last 8760 hours. HbA1C: No results for input(s): HGBA1C in the last 72 hours. CBG: No results for input(s): GLUCAP in the last 168 hours. Lipid Profile: No results for input(s): CHOL, HDL, LDLCALC, TRIG, CHOLHDL, LDLDIRECT in the last 72 hours. Thyroid Function Tests: No results for input(s): TSH, T4TOTAL, FREET4, T3FREE, THYROIDAB in the last 72 hours. Anemia Panel: No results for input(s): VITAMINB12, FOLATE, FERRITIN, TIBC, IRON, RETICCTPCT in the last 72 hours. Urine analysis:    Component Value Date/Time   COLORURINE YELLOW 11/27/2019 1529   APPEARANCEUR Cloudy (A) 11/27/2019 1529   LABSPEC 1.025 11/27/2019 1529   PHURINE 5.5 11/27/2019 1529   GLUCOSEU NEGATIVE 11/27/2019 1529   HGBUR NEGATIVE 11/27/2019 1529   BILIRUBINUR NEGATIVE 11/27/2019 1529   BILIRUBINUR small 01/08/2013 1026   KETONESUR NEGATIVE 11/27/2019 1529   PROTEINUR neg 01/08/2013 1026   UROBILINOGEN 0.2 11/27/2019 1529   NITRITE NEGATIVE 11/27/2019 1529   LEUKOCYTESUR NEGATIVE 11/27/2019 1529    Radiological Exams on Admission: DG Chest 2 View  Result Date: 05/01/2020 CLINICAL DATA:  Shortness of  breath EXAM: CHEST - 2 VIEW COMPARISON:  06/23/2018, CT chest 04/27/2020 FINDINGS: Small right pleural effusion. Patchy peripheral right lower lobe opacity, likely corresponding to airspace disease on CT and suspect for pulmonary infarction in the setting of PE. Normal cardiomediastinal silhouette. No pneumothorax IMPRESSION: Small right pleural effusion with patchy peripheral airspace disease at the right base, likely corresponding to suspected pulmonary infarct on CT Electronically Signed   By: Jasmine Pang M.D.   On: 05/01/2020 19:15   CT Angio Chest PE W and/or Wo Contrast  Result Date: 05/01/2020 CLINICAL DATA:  Worsening chest pain and shortness of breath. Recent diagnosis of bilateral pulmonary embolism on treatment. EXAM: CT ANGIOGRAPHY CHEST WITH CONTRAST TECHNIQUE: Multidetector CT imaging of the chest was performed using the standard protocol during bolus administration of intravenous contrast. Multiplanar CT image reconstructions and MIPs were obtained to evaluate the vascular anatomy. CONTRAST:  34mL OMNIPAQUE IOHEXOL 350 MG/ML SOLN COMPARISON:  04/27/2020 FINDINGS: Cardiovascular: Moderately good opacification of the central and segmental pulmonary arteries. Although small peripheral filling defects are again demonstrated, there is significant decrease in the visualized clot burden since the previous study consistent with interval response to therapy. No new emboli are demonstrated. Normal heart size. No pericardial effusions. Normal caliber thoracic aorta. Great vessel origins are patent. Mediastinum/Nodes: Scattered mediastinal lymph nodes are not pathologically enlarged. Esophagus is decompressed. Lungs/Pleura: Small but increasing right pleural effusion and basilar atelectasis. This could represent pneumonia or compressive atelectasis. No pneumothorax. Airways are patent. Upper Abdomen: No acute abnormalities are demonstrated in the visualized upper abdomen. Musculoskeletal: No chest wall  abnormality. No acute or significant osseous findings. Review of the MIP images confirms the above findings. IMPRESSION: 1. There is significant decrease in the visualized clot burden since the previous study consistent with interval response to therapy. No new emboli are demonstrated. 2. Small but increasing right pleural effusion and basilar atelectasis. This could represent pneumonia or compressive atelectasis. Electronically Signed   By: Burman Nieves M.D.   On: 05/01/2020 23:29    EKG: Independently reviewed.  Sinus rhythm, no significant change since prior tracing.  Assessment/Plan Principal Problem:   Bilateral pulmonary embolism (HCC) Active Problems:   Elevated troponin  Acute bilateral segmental PE: He has a history of prior PE diagnosed in January 2020 and was treated with a course of Eliquis. Patient was seen in the ED on 04/27/2020 for right-sided chest pain and CT angiogram revealed multiple new bilateral segmental pulmonary emboli with right basilar pulmonary infarct; no CT evidence of right heart strain.  Echo was reassuring and without evidence of right heart strain.  Bilateral lower extremity Dopplers negative for DVT.  He was discharged with a short course of pain meds and an Eliquis starter pack with plan to follow-up with PCP in the next 2 to 4 weeks.  Patient is now presenting to the ED again with complaints of worsening chest pain and shortness of breath.  Not tachypneic or hypoxic at rest (satting 100%).  However, his sats dropped to 70% on room air with ambulation in the ED. Repeat CT angiogram was done and revealed significant decrease in the visualized clot burden since the previous study consistent with interval response to therapy.  No new emboli demonstrated.  Showing small but increasing right pleural effusion and basilar atelectasis, could represent pneumonia or compressive atelectasis.  Pneumonia felt to be less likely given no fever or leukocytosis.  SARS-CoV-2 PCR  test and influenza panel done 04/27/2020 were negative. -Stat ABG, if concerning, will speak to pulmonology.  Continue Eliquis for anticoagulation as based on findings from repeat CT angiogram done today it does not appear that he has failed this therapy. Pain management with Toradol as needed, OxyIR as needed, and Tylenol as needed.  Check procalcitonin level.  Continuous pulse ox, supplemental oxygen if needed.  Will order hypercoagulable work-up.  Addendum: Patient states he has already been vaccinated against Covid.  Mild troponin elevation: Likely due to demand ischemia in the setting of acute bilateral segmental PE. High sensitive troponin mildly elevated but stable 52 >49.  EKG without acute changes. -Continue cardiac monitoring  DVT prophylaxis: Eliquis Code Status: Full code Family Communication: No family available at this time. Disposition Plan: Status is: Observation  The patient remains OBS appropriate and will d/c before 2 midnights.  Dispo: The patient is from: Home              Anticipated d/c is to: Home              Anticipated d/c date is: 2 days              Patient currently is not medically stable to d/c.  The medical decision making on this patient was of high complexity and the patient is at high risk for clinical deterioration, therefore this is a level 3 visit.  John GiovanniVasundhra Reshanda Lewey MD [facility]  If 7PM-7AM, please contact night-coverage [email or web address]  05/02/2020, 1:05 AM

## 2020-05-02 NOTE — Progress Notes (Signed)
ANTICOAGULATION CONSULT NOTE - Initial Consult  Pharmacy Consult for Eliquis Indication: pulmonary embolus  No Known Allergies  Patient Measurements:    Vital Signs: Temp: 97.9 F (36.6 C) (11/24 1735) Temp Source: Oral (11/24 1735) BP: 127/77 (11/25 0000) Pulse Rate: 69 (11/25 0000)  Labs: Recent Labs    05/01/20 1851 05/01/20 2037  HGB 15.8  --   HCT 45.6  --   PLT 282  --   CREATININE 1.32*  --   TROPONINIHS 52* 49*    Estimated Creatinine Clearance: 80.6 mL/min (A) (by C-G formula based on SCr of 1.32 mg/dL (H)).   Medical History: Past Medical History:  Diagnosis Date  . Pulmonary emboli (HCC)     Medications:  Eliquis 10mg  po BID thru 11/27 then 5mg  po BID  Assessment: 39 yo M diagnosed with PE and started on Eliquis 11/20.  He returns today with worsening shortness of breath & hemoptysis. CT scan + improved clot burden, no right heart strain.  Hypoxia on ambulation.   CBC WNL/stable since starting Eliquis.    Plan:  Continue Eliquis 10mg  po BID thru 11/27 then 5mg  po BID Monitor for s/sx of bleeding  12/20 PharmD 05/02/2020,1:14 AM

## 2020-05-02 NOTE — ED Notes (Signed)
MD present in room  

## 2020-05-03 LAB — FACTOR 5 LEIDEN

## 2020-05-03 LAB — PROTHROMBIN GENE MUTATION

## 2020-05-03 LAB — CARDIOLIPIN ANTIBODIES, IGG, IGM, IGA
Anticardiolipin IgA: <9 APL U/mL (ref 0–11)
Anticardiolipin IgG: <9 GPL U/mL (ref 0–14)
Anticardiolipin IgM: <9 MPL U/mL (ref 0–12)

## 2020-05-03 LAB — PROTEIN C, TOTAL: Protein C, Total: 59 % — ABNORMAL LOW (ref 60–150)

## 2020-05-04 LAB — DRVVT MIX: dRVVT Mix: 69.4 s — ABNORMAL HIGH (ref 0.0–40.4)

## 2020-05-04 LAB — LUPUS ANTICOAGULANT PANEL
DRVVT: 177.7 s — ABNORMAL HIGH (ref 0.0–47.0)
PTT Lupus Anticoagulant: 38.2 s (ref 0.0–51.9)

## 2020-05-04 LAB — PROTEIN C ACTIVITY: Protein C Activity: 80 % (ref 73–180)

## 2020-05-04 LAB — PROTEIN S ACTIVITY: Protein S Activity: 94 % (ref 63–140)

## 2020-05-04 LAB — PROTEIN S, TOTAL: Protein S Ag, Total: 108 % (ref 60–150)

## 2020-05-04 LAB — DRVVT CONFIRM: dRVVT Confirm: 1.7 ratio — ABNORMAL HIGH (ref 0.8–1.2)

## 2020-05-05 LAB — HOMOCYSTEINE: Homocysteine: 29.7 umol/L — ABNORMAL HIGH (ref 0.0–14.5)

## 2020-05-06 LAB — BETA-2-GLYCOPROTEIN I ABS, IGG/M/A
Beta-2 Glyco I IgG: <9 GPI IgG units (ref 0–20)
Beta-2-Glycoprotein I IgA: 13 GPI IgA units (ref 0–25)
Beta-2-Glycoprotein I IgM: <9 GPI IgM units (ref 0–32)

## 2020-05-14 ENCOUNTER — Encounter: Payer: Self-pay | Admitting: Family

## 2020-05-14 ENCOUNTER — Ambulatory Visit (INDEPENDENT_AMBULATORY_CARE_PROVIDER_SITE_OTHER): Payer: 59 | Admitting: Family

## 2020-05-14 ENCOUNTER — Other Ambulatory Visit: Payer: Self-pay

## 2020-05-14 VITALS — BP 122/78 | HR 63 | Temp 97.8°F | Ht 68.0 in | Wt 184.6 lb

## 2020-05-14 DIAGNOSIS — I2699 Other pulmonary embolism without acute cor pulmonale: Secondary | ICD-10-CM | POA: Diagnosis not present

## 2020-05-14 MED ORDER — APIXABAN 5 MG PO TABS: 5.0000 mg | ORAL_TABLET | Freq: Two times a day (BID) | ORAL | 5 refills | Status: DC

## 2020-05-14 NOTE — Patient Instructions (Signed)
Pulmonary Embolism  A pulmonary embolism (PE) is a sudden blockage or decrease of blood flow in one or both lungs. Most blockages come from a blood clot that forms in the vein of a lower leg, thigh, or arm (deep vein thrombosis, DVT) and travels to the lungs. A clot is blood that has thickened into a gel or solid. PE is a dangerous and life-threatening condition that needs to be treated right away. What are the causes? This condition is usually caused by a blood clot that forms in a vein and moves to the lungs. In rare cases, it may be caused by air, fat, part of a tumor, or other tissue that moves through the veins and into the lungs. What increases the risk? The following factors may make you more likely to develop this condition:  Experiencing a traumatic injury, such as breaking a hip or leg.  Having: ? A spinal cord injury. ? Orthopedic surgery, especially hip or knee replacement. ? Any major surgery. ? A stroke. ? DVT. ? Blood clots or blood clotting disease. ? Long-term (chronic) lung or heart disease. ? Cancer treated with chemotherapy. ? A central venous catheter.  Taking medicines that contain estrogen. These include birth control pills and hormone replacement therapy.  Being: ? Pregnant. ? In the period of time after your baby is delivered (postpartum). ? Older than age 60. ? Overweight. ? A smoker, especially if you have other risks. What are the signs or symptoms? Symptoms of this condition usually start suddenly and include:  Shortness of breath during activity or at rest.  Coughing, coughing up blood, or coughing up blood-tinged mucus.  Chest pain that is often worse with deep breaths.  Rapid or irregular heartbeat.  Feeling light-headed or dizzy.  Fainting.  Feeling anxious.  Fever.  Sweating.  Pain and swelling in a leg. This is a symptom of DVT, which can lead to PE. How is this diagnosed? This condition may be diagnosed based on:  Your medical  history.  A physical exam.  Blood tests.  CT pulmonary angiogram. This test checks blood flow in and around your lungs.  Ventilation-perfusion scan, also called a lung VQ scan. This test measures air flow and blood flow to the lungs.  An ultrasound of the legs. How is this treated? Treatment for this condition depends on many factors, such as the cause of your PE, your risk for bleeding or developing more clots, and other medical conditions you have. Treatment aims to remove, dissolve, or stop blood clots from forming or growing larger. Treatment may include:  Medicines, such as: ? Blood thinning medicines (anticoagulants) to stop clots from forming. ? Medicines that dissolve clots (thrombolytics).  Procedures, such as: ? Using a flexible tube to remove a blood clot (embolectomy) or to deliver medicine to destroy it (catheter-directed thrombolysis). ? Inserting a filter into a large vein that carries blood to the heart (inferior vena cava). This filter (vena cava filter) catches blood clots before they reach the lungs. ? Surgery to remove the clot (surgical embolectomy). This is rare. You may need a combination of immediate, long-term (up to 3 months after diagnosis), and extended (more than 3 months after diagnosis) treatments. Your treatment may continue for several months (maintenance therapy). You and your health care provider will work together to choose the treatment program that is best for you. Follow these instructions at home: Medicines  Take over-the-counter and prescription medicines only as told by your health care provider.  If you   are taking an anticoagulant medicine: ? Take the medicine every day at the same time each day. ? Understand what foods and drugs interact with your medicine. ? Understand the side effects of this medicine, including excessive bruising or bleeding. Ask your health care provider or pharmacist about other side effects. General  instructions  Wear a medical alert bracelet or carry a medical alert card that says you have had a PE and lists what medicines you take.  Ask your health care provider when you may return to your normal activities. Avoid sitting or lying for a long time without moving.  Maintain a healthy weight. Ask your health care provider what weight is healthy for you.  Do not use any products that contain nicotine or tobacco, such as cigarettes, e-cigarettes, and chewing tobacco. If you need help quitting, ask your health care provider.  Talk with your health care provider about any travel plans. It is important to make sure that you are still able to take your medicine while on trips.  Keep all follow-up visits as told by your health care provider. This is important. Contact a health care provider if:  You missed a dose of your blood thinner medicine. Get help right away if:  You have: ? New or increased pain, swelling, warmth, or redness in an arm or leg. ? Numbness or tingling in an arm or leg. ? Shortness of breath during activity or at rest. ? A fever. ? Chest pain. ? A rapid or irregular heartbeat. ? A severe headache. ? Vision changes. ? A serious fall or accident, or you hit your head. ? Stomach (abdominal) pain. ? Blood in your vomit, stool, or urine. ? A cut that will not stop bleeding.  You cough up blood.  You feel light-headed or dizzy.  You cannot move your arms or legs.  You are confused or have memory loss. These symptoms may represent a serious problem that is an emergency. Do not wait to see if the symptoms will go away. Get medical help right away. Call your local emergency services (911 in the [place] Do not drive yourself to the hospital. Summary  A pulmonary embolism (PE) is a sudden blockage or decrease of blood flow in one or both lungs. PE is a dangerous and life-threatening condition that needs to be treated right away.  Treatments for this condition usually  include medicines to thin your blood (anticoagulants) or medicines to break apart blood clots (thrombolytics).  If you are given blood thinners, it is important to take the medicine every day at the same time each day.  Understand what foods and drugs interact with any medicines that you are taking.  If you have signs of PE or DVT, call your local emergency services (911 in the [place] This information is not intended to replace advice given to you by your health care provider. Make sure you discuss any questions you have with your health care provider. Document Revised: 03/02/2018 Document Reviewed: 03/02/2018 Elsevier Patient Education  2020 [employer] Inc[employer]  

## 2020-05-15 ENCOUNTER — Encounter: Payer: Self-pay | Admitting: Family

## 2020-05-15 NOTE — Progress Notes (Signed)
Acute Office Visit  Subjective:    Patient ID: Stephen Farmer, male    DOB: [birth year], 39 y.o.   MRN: [id number]  Chief Complaint  Patient presents with  . Follow-up    hospital follow up    HPI Patient is in today for a hospital follow-up. Patient was sent to the ED Nov 24th with increased right chest pain after starting Eliquis for PE's Patient was admitted overnight stay and was discharge after his O2 sats improved from 70% to 90s with ambulation. He is doing much better now. Tolerating the medication well and has no pain.    Past Medical History:  Diagnosis Date  . Pulmonary emboli (HCC)     History reviewed. No pertinent surgical history.  Family History  Problem Relation Age of Onset  . Hypertension Mother   . Sleep apnea Father   . Stroke Neg Hx     Social History   Socioeconomic History  . Marital status: Single    Spouse name: Not on file  . Number of children: Not on file  . Years of education: Not on file  . Highest education level: Not on file  Occupational History  . Not on file  Tobacco Use  . Smoking status: Never Smoker  . Smokeless tobacco: Never Used  Vaping Use  . Vaping Use: Never used  Substance and Sexual Activity  . Alcohol use: Yes    Alcohol/week: 1.0 standard drink    Types: 1 Standard drinks or equivalent per week    Comment: social  . Drug use: No  . Sexual activity: Yes  Other Topics Concern  . Not on file  Social History Narrative  . Not on file   Social Determinants of Health   Financial Resource Strain:   . Difficulty of Paying Living Expenses: Not on file  Food Insecurity:   . Worried About Programme researcher, broadcasting/film/video in the Last Year: Not on file  . Ran Out of Food in the Last Year: Not on file  Transportation Needs:   . Lack of Transportation (Medical): Not on file  . Lack of Transportation (Non-Medical): Not on file  Physical Activity:   . Days of Exercise per Week: Not on file  . Minutes of Exercise per Session: Not  on file  Stress:   . Feeling of Stress : Not on file  Social Connections:   . Frequency of Communication with Friends and Family: Not on file  . Frequency of Social Gatherings with Friends and Family: Not on file  . Attends Religious Services: Not on file  . Active Member of Clubs or Organizations: Not on file  . Attends Banker Meetings: Not on file  . Marital Status: Not on file  Intimate Partner Violence:   . Fear of Current or Ex-Partner: Not on file  . Emotionally Abused: Not on file  . Physically Abused: Not on file  . Sexually Abused: Not on file    Outpatient Medications Prior to Visit  Medication Sig Dispense Refill  . acetaminophen (TYLENOL) 325 MG tablet Take 2 tablets (650 mg total) by mouth every 6 (six) hours as needed for mild pain, fever or headache (or Fever >/= 101).    Marland Kitchen acetaminophen-codeine (TYLENOL #3) 300-30 MG tablet Take 1 tablet by mouth every 8 (eight) hours as needed for moderate pain.    Marland Kitchen apixaban (ELIQUIS) 5 MG TABS tablet Take 2 tablets daily for 7 days then resume your current 5 mg twice  daily regimen (Patient taking differently: Take 5 mg by mouth See admin instructions. Take 2 tablets in the morning and 2 tablets at night until 05/04/20 and after that 1 tablet 2 times daily.) 14 tablet 0  . Ascorbic Acid (VITAMIN C ADULT GUMMIES PO) Take 1 tablet by mouth daily.    . Hyprom-Naphaz-Polysorb-Zn Sulf (CLEAR EYES COMPLETE OP) Place 1 drop into both eyes daily as needed (dry eyes).    Marland Kitchen omeprazole (PRILOSEC) 20 MG capsule Take 20 mg by mouth daily as needed (heartburn).      No facility-administered medications prior to visit.    No Known Allergies  Review of Systems  Constitutional: Negative.   HENT: Negative.   Respiratory: Negative.   Cardiovascular: Negative.   Gastrointestinal: Negative.   Endocrine: Negative.   Genitourinary: Negative.   Musculoskeletal: Negative.   Skin: Negative.   Allergic/Immunologic: Negative.    Neurological: Negative.   Psychiatric/Behavioral: Negative.   All other systems reviewed and are negative.      Objective:    Physical Exam Vitals reviewed.  Constitutional:      Appearance: Normal appearance.  HENT:     Right Ear: Tympanic membrane and ear canal normal.     Left Ear: Tympanic membrane and ear canal normal.     Mouth/Throat:     Mouth: Mucous membranes are dry.  Cardiovascular:     Rate and Rhythm: Normal rate and regular rhythm.  Pulmonary:     Effort: Pulmonary effort is normal.     Breath sounds: Normal breath sounds.  Abdominal:     General: Abdomen is flat.     Palpations: Abdomen is soft.  Musculoskeletal:        General: Normal range of motion.     Cervical back: Normal range of motion and neck supple.  Skin:    General: Skin is warm and dry.  Neurological:     General: No focal deficit present.     Mental Status: He is alert and oriented to person, place, and time.  Psychiatric:        Mood and Affect: Mood normal.        Behavior: Behavior normal.     BP 122/78   Pulse 63   Temp 97.8 F (36.6 C) (Tympanic)   Ht 5\' 8"  (1.727 m)   Wt 184 lb 9.6 oz (83.7 kg)   SpO2 98%   BMI 28.07 kg/m  Wt Readings from Last 3 Encounters:  05/14/20 184 lb 9.6 oz (83.7 kg)  05/02/20 182 lb 5.1 oz (82.7 kg)  05/01/20 187 lb 12.8 oz (85.2 kg)    Health Maintenance Due  Topic Date Due  . TETANUS/TDAP  Never done  . INFLUENZA VACCINE  Never done    There are no preventive care reminders to display for this patient.   No results found for: TSH Lab Results  Component Value Date   WBC 6.0 05/01/2020   HGB 15.8 05/01/2020   HCT 45.6 05/01/2020   MCV 97.9 05/01/2020   PLT 282 05/01/2020   Lab Results  Component Value Date   NA 137 05/01/2020   K 3.9 05/01/2020   CO2 23 05/01/2020   GLUCOSE 90 05/01/2020   BUN 11 05/01/2020   CREATININE 1.32 (H) 05/01/2020   BILITOT 0.5 11/27/2019   ALKPHOS 61 11/27/2019   AST 32 11/27/2019   ALT 29  11/27/2019   PROT 7.8 11/27/2019   ALBUMIN 4.7 11/27/2019   CALCIUM 9.2 05/01/2020   ANIONGAP  12 05/01/2020   GFR 97.54 11/27/2019   Lab Results  Component Value Date   CHOL 311 (H) 11/27/2019   Lab Results  Component Value Date   HDL 61.50 11/27/2019   Lab Results  Component Value Date   LDLCALC 222 (H) 11/27/2019   Lab Results  Component Value Date   TRIG 140.0 11/27/2019   Lab Results  Component Value Date   CHOLHDL 5 11/27/2019   No results found for: HGBA1C     Assessment & Plan:   Problem List Items Addressed This Visit    Bilateral pulmonary embolism (HCC) - Primary   Relevant Medications   apixaban (ELIQUIS) 5 MG TABS tablet   Other Relevant Orders   Ambulatory referral to Hematology       Meds ordered this encounter  Medications  . apixaban (ELIQUIS) 5 MG TABS tablet    Sig: Take 1 tablet (5 mg total) by mouth 2 (two) times daily.    Dispense:  60 tablet    Refill:  5    Recheck in 6 months and sooner as needed.    Eulis Foster, FNP

## 2020-05-20 ENCOUNTER — Telehealth: Payer: Self-pay | Admitting: Oncology

## 2020-05-20 NOTE — Telephone Encounter (Signed)
Received a new hem referral from Dr. Doreene Burke for Bilateral pulmonary embolism. Mr. Ordway has been cld and scheduled to see Dr. Clelia Croft on 12/30 at 2pm. Pt aware to arrive 30 minutes early.

## 2020-06-06 ENCOUNTER — Other Ambulatory Visit: Payer: Self-pay

## 2020-06-06 ENCOUNTER — Inpatient Hospital Stay: Payer: 59 | Attending: Oncology | Admitting: Oncology

## 2020-06-06 VITALS — BP 124/88 | HR 56 | Temp 97.9°F | Resp 20 | Wt 185.5 lb

## 2020-06-06 DIAGNOSIS — I2699 Other pulmonary embolism without acute cor pulmonale: Secondary | ICD-10-CM

## 2020-06-06 DIAGNOSIS — I82492 Acute embolism and thrombosis of other specified deep vein of left lower extremity: Secondary | ICD-10-CM

## 2020-06-06 DIAGNOSIS — Z7901 Long term (current) use of anticoagulants: Secondary | ICD-10-CM | POA: Diagnosis not present

## 2020-06-06 DIAGNOSIS — Z86711 Personal history of pulmonary embolism: Secondary | ICD-10-CM | POA: Insufficient documentation

## 2020-06-06 DIAGNOSIS — Z79899 Other long term (current) drug therapy: Secondary | ICD-10-CM | POA: Insufficient documentation

## 2020-06-06 NOTE — Progress Notes (Signed)
Reason for the request:    Venous thromboembolism  HPI: I was asked by Dr. Doreene Burke to evaluate Mr. Stephen Farmer for the evaluation of pulmonary embolism.  He is a 39 year old man presented with chest pain and leg pain in January 2020.  At that time he had a CT scan of the chest which showed bilateral lower lobe pulmonary emboli with embolus in the portion of the right interlobar pulmonary artery.  He was anticoagulated with Eliquis at that time for 6 months.  Hypercoagulable work-up at that time was on revealing.  He subsequently presented on April 27, 2020 with complaints of chest pain and shortness of breath and CT scan at that time showed multiple new bilateral segmental pulmonary emboli within the lower lobes.  He was restarted on Eliquis at that time.  He was seen 4 days later and complained of chest pain repeat CT scan on May 01, 2020 showed significant decrease in his clot burden at that time.  Hypercoagulable panel obtained on 2 separate occasions was unremarkable in April 2020.  Repeat on May 02, 2020 showed a positive lupus anticoagulant.  Clinically, he feels well at this time without any major complaints.  He denies any bleeding complications related to Eliquis.  Denies any nausea vomiting or abdominal pain.  He denies any hematochezia or melena.  He denies any family history of thrombosis.   He does not report any headaches, blurry vision, syncope or seizures. Does not report any fevers, chills or sweats.  Does not report any cough, wheezing or hemoptysis.  Does not report any chest pain, palpitation, orthopnea or leg edema.  Does not report any nausea, vomiting or abdominal pain.  Does not report any constipation or diarrhea.  Does not report any skeletal complaints.    Does not report frequency, urgency or hematuria.  Does not report any skin rashes or lesions. Does not report any heat or cold intolerance.  Does not report any lymphadenopathy or petechiae.  Does not report any anxiety or  depression.  Remaining review of systems is negative.    Past Medical History:  Diagnosis Date  . Pulmonary emboli (HCC)   :  No past surgical history on file.:   Current Outpatient Medications:  .  acetaminophen (TYLENOL) 325 MG tablet, Take 2 tablets (650 mg total) by mouth every 6 (six) hours as needed for mild pain, fever or headache (or Fever >/= 101)., Disp: , Rfl:  .  acetaminophen-codeine (TYLENOL #3) 300-30 MG tablet, Take 1 tablet by mouth every 8 (eight) hours as needed for moderate pain., Disp: , Rfl:  .  apixaban (ELIQUIS) 5 MG TABS tablet, Take 2 tablets daily for 7 days then resume your current 5 mg twice daily regimen (Patient taking differently: Take 5 mg by mouth See admin instructions. Take 2 tablets in the morning and 2 tablets at night until 05/04/20 and after that 1 tablet 2 times daily.), Disp: 14 tablet, Rfl: 0 .  apixaban (ELIQUIS) 5 MG TABS tablet, Take 1 tablet (5 mg total) by mouth 2 (two) times daily., Disp: 60 tablet, Rfl: 5 .  Ascorbic Acid (VITAMIN C ADULT GUMMIES PO), Take 1 tablet by mouth daily., Disp: , Rfl:  .  Hyprom-Naphaz-Polysorb-Zn Sulf (CLEAR EYES COMPLETE OP), Place 1 drop into both eyes daily as needed (dry eyes)., Disp: , Rfl:  .  omeprazole (PRILOSEC) 20 MG capsule, Take 20 mg by mouth daily as needed (heartburn). , Disp: , Rfl: :  No Known Allergies:  Family History  Problem Relation Age of Onset  . Hypertension Mother   . Sleep apnea Father   . Stroke Neg Hx   :  Social History   Socioeconomic History  . Marital status: Single    Spouse name: Not on file  . Number of children: Not on file  . Years of education: Not on file  . Highest education level: Not on file  Occupational History  . Not on file  Tobacco Use  . Smoking status: Never Smoker  . Smokeless tobacco: Never Used  Vaping Use  . Vaping Use: Never used  Substance and Sexual Activity  . Alcohol use: Yes    Alcohol/week: 1.0 standard drink    Types: 1 Standard  drinks or equivalent per week    Comment: social  . Drug use: No  . Sexual activity: Yes  Other Topics Concern  . Not on file  Social History Narrative  . Not on file   Social Determinants of Health   Financial Resource Strain: Not on file  Food Insecurity: Not on file  Transportation Needs: Not on file  Physical Activity: Not on file  Stress: Not on file  Social Connections: Not on file  Intimate Partner Violence: Not on file  :  Pertinent items are noted in HPI.  Exam: Blood pressure 124/88, pulse (!) 56, temperature 97.9 F (36.6 C), temperature source Tympanic, resp. rate 20, weight 185 lb 8 oz (84.1 kg), SpO2 100 %.  ECOG 0  General appearance: alert and cooperative appeared without distress. Head: atraumatic without any abnormalities. Eyes: conjunctivae/corneas clear. PERRL.  Sclera anicteric. Throat: lips, mucosa, and tongue normal; without oral thrush or ulcers. Resp: clear to auscultation bilaterally without rhonchi, wheezes or dullness to percussion. Cardio: regular rate and rhythm, S1, S2 normal, no murmur, click, rub or gallop GI: soft, non-tender; bowel sounds normal; no masses,  no organomegaly Skin: Skin color, texture, turgor normal. No rashes or lesions Lymph nodes: Cervical, supraclavicular, and axillary nodes normal. Neurologic: Grossly normal without any motor, sensory or deep tendon reflexes. Musculoskeletal: No joint deformity or effusion.    Assessment and Plan:    39 year old man with:  1.  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  He was anticoagulated for 6 months and had a repeat thrombosis with another pulmonary embolism in November 2021.  Hypercoagulable work-up on 2 separate occasions was inconclusive.  The natural course of these findings and treatment options were discussed at this time.  Given the unprovoked nature of his second thrombosis it is very likely he is at high risk of having  another thrombosis off anticoagulation.  Risks and benefits of such.  Of anticoagulation versus lifetime anticoagulation were discussed.  Risk of bleeding was assessed at this time.  Given the fact that he had two potentially life threatening unprovoked venous thromboembolus with pulmonary embolism I have recommended lifetime anticoagulation at this time.  We have discussed in detail the risk of bleeding associated with lifetime anticoagulation.  Discontinuation of anticoagulation will be considered if there is contraindication in the future.  2.  Positive lupus anticoagulant testing: This was completed in May 02, 2020.  This could be a true finding versus a false positive because of Eliquis.  He had a negative testing in April 2020.  I do not feel strongly about repeat testing at this time as it will not change our management of lifetime anticoagulation.  3.  Follow-up: I am happy to see him in the future  as needed.   45  minutes were dedicated to this visit. The time was spent on reviewing laboratory data, imaging studies, discussing treatment options, discussing differential diagnosis and answering questions regarding future plan.    A copy of this consult has been forwarded to the requesting physician.

## 2020-07-24 ENCOUNTER — Telehealth: Payer: Self-pay

## 2020-07-24 NOTE — Telephone Encounter (Signed)
PA for Eliquis filled out/signed/faxed to [employer] @ [phone].  Waiting on response. Dm/cma

## 2020-11-14 ENCOUNTER — Ambulatory Visit: Payer: 59 | Admitting: Family Medicine

## 2021-01-03 ENCOUNTER — Telehealth: Payer: Self-pay | Admitting: Family Medicine

## 2021-01-03 DIAGNOSIS — Z0000 Encounter for general adult medical examination without abnormal findings: Secondary | ICD-10-CM

## 2021-01-03 NOTE — Telephone Encounter (Signed)
What is the name of the medication? apixaban (ELIQUIS) 5 MG TABS tablet [[id number]]   Have you contacted your pharmacy to request a refill? Pt would like a refill on this script.   Which pharmacy would you like this sent to? Pharmacy  [id number] [place], [place] - [id number]  [address] [place] [address]  Phone:  [phone]  Fax:  [phone]  DEA #:  --     Patient notified that their request is being sent to the clinical staff for review and that they should receive a call once it is complete. If they do not receive a call within 72 hours they can check with their pharmacy or our office.

## 2021-01-06 MED ORDER — APIXABAN 5 MG PO TABS: 5.0000 mg | ORAL_TABLET | Freq: Two times a day (BID) | ORAL | 5 refills | Status: DC

## 2022-02-24 ENCOUNTER — Other Ambulatory Visit: Payer: Self-pay | Admitting: Family Medicine

## 2022-02-24 DIAGNOSIS — Z86711 Personal history of pulmonary embolism: Secondary | ICD-10-CM

## 2022-02-24 MED ORDER — APIXABAN 5 MG PO TABS: ORAL_TABLET | ORAL | 1 refills | Status: DC

## 2022-02-24 NOTE — Telephone Encounter (Signed)
Refill request for pending Rx last OV 05/14/21 last refill 01/06/21 patient states that he have been without insurance reasoning for no appointments. Patient feels like he needs prescription out for about 1 month. Patient has an upcoming appointment schedule 04/02/22 this will be when insurance is active. Okay to refill until appointment? Please advise

## 2022-02-24 NOTE — Telephone Encounter (Signed)
Refill sent in patient aware  

## 2022-02-24 NOTE — Telephone Encounter (Signed)
Caller Name: lonnel Sobol Call back phone #: [phone]   MEDICATION(S):  apixaban (ELIQUIS) 5 MG TABS tablet [[id number]]   Days of Med Remaining:   Has the patient contacted their pharmacy (YES/NO)? y What did pharmacy advise?   Preferred Pharmacy:  [id number] [place], [place] Phone:  [phone]      ~~~Please advise patient/caregiver to allow 2-3 business days to process RX refills. Pt stated said med need to be updated

## 2022-04-02 ENCOUNTER — Ambulatory Visit: Payer: Managed Care, Other (non HMO) | Admitting: Family Medicine

## 2022-04-02 ENCOUNTER — Encounter: Payer: Self-pay | Admitting: Family Medicine

## 2022-04-02 VITALS — BP 156/96 | HR 45 | Temp 98.0°F | Ht 68.0 in | Wt 192.2 lb

## 2022-04-02 DIAGNOSIS — Z86711 Personal history of pulmonary embolism: Secondary | ICD-10-CM | POA: Diagnosis not present

## 2022-04-02 DIAGNOSIS — Z125 Encounter for screening for malignant neoplasm of prostate: Secondary | ICD-10-CM

## 2022-04-02 DIAGNOSIS — Z114 Encounter for screening for human immunodeficiency virus [HIV]: Secondary | ICD-10-CM

## 2022-04-02 DIAGNOSIS — Z113 Encounter for screening for infections with a predominantly sexual mode of transmission: Secondary | ICD-10-CM

## 2022-04-02 DIAGNOSIS — Z0000 Encounter for general adult medical examination without abnormal findings: Secondary | ICD-10-CM | POA: Diagnosis not present

## 2022-04-02 DIAGNOSIS — Z1159 Encounter for screening for other viral diseases: Secondary | ICD-10-CM | POA: Diagnosis not present

## 2022-04-02 DIAGNOSIS — L739 Follicular disorder, unspecified: Secondary | ICD-10-CM | POA: Diagnosis not present

## 2022-04-02 LAB — COMPREHENSIVE METABOLIC PANEL
ALT: 19 U/L (ref 0–53)
AST: 20 U/L (ref 0–37)
Albumin: 4.2 g/dL (ref 3.5–5.2)
Alkaline Phosphatase: 49 U/L (ref 39–117)
BUN: 13 mg/dL (ref 6–23)
CO2: 31 mEq/L (ref 19–32)
Calcium: 9 mg/dL (ref 8.4–10.5)
Chloride: 99 mEq/L (ref 96–112)
Creatinine, Ser: 1.11 mg/dL (ref 0.40–1.50)
GFR: 82.88 mL/min (ref >60.00)
Glucose, Bld: 80 mg/dL (ref 70–99)
Potassium: 3.5 mEq/L (ref 3.5–5.1)
Sodium: 137 mEq/L (ref 135–145)
Total Bilirubin: 1.1 mg/dL (ref 0.2–1.2)
Total Protein: 7 g/dL (ref 6.0–8.3)

## 2022-04-02 LAB — LIPID PANEL
Cholesterol: 241 mg/dL — ABNORMAL HIGH (ref 0–200)
HDL: 81 mg/dL (ref >39.00)
LDL Cholesterol: 132 mg/dL — ABNORMAL HIGH (ref 0–99)
NonHDL: 160.06
Total CHOL/HDL Ratio: 3
Triglycerides: 142 mg/dL (ref 0.0–149.0)
VLDL: 28.4 mg/dL (ref 0.0–40.0)

## 2022-04-02 LAB — PSA: PSA: 0.39 ng/mL (ref 0.10–4.00)

## 2022-04-02 LAB — CBC
HCT: 44.4 % (ref 39.0–52.0)
Hemoglobin: 15 g/dL (ref 13.0–17.0)
MCHC: 33.8 g/dL (ref 30.0–36.0)
MCV: 101.6 fl — ABNORMAL HIGH (ref 78.0–100.0)
Platelets: 212 10*3/uL (ref 150.0–400.0)
RBC: 4.37 Mil/uL (ref 4.22–5.81)
RDW: 14.2 % (ref 11.5–15.5)
WBC: 5.6 10*3/uL (ref 4.0–10.5)

## 2022-04-02 MED ORDER — DOXYCYCLINE HYCLATE 100 MG PO TABS: 100.0000 mg | ORAL_TABLET | Freq: Two times a day (BID) | ORAL | 0 refills | Status: AC

## 2022-04-02 MED ORDER — APIXABAN 5 MG PO TABS: ORAL_TABLET | ORAL | 2 refills | Status: DC

## 2022-04-02 NOTE — Progress Notes (Signed)
Established Patient Office Visit  Subjective   Patient ID: Stephen Farmer, male    DOB: 1981-01-28  Age: 41 y.o. MRN: [id number]  Chief Complaint  Patient presents with  . Annual Exam    CPE, check rash on thighs very irritable x 2 weeks. Patient fasting.     HPI for physical exam and follow-up of history of recurrent PE and more recently a rash involving his thighs and buttocks.  Rash was recently treated with Diflucan and a prednisone burst.  It did not resolve.  Rashes been irritated and sore at times.  He has seen individual pustules.  He is on lifetime prophylaxis for recurrent PEs.  He is doing well with Eliquis.  Denies frank bleeding in his stool or urine.  He is active physically by going to the gym.  He has regular dental care.  Life is generally going well for him.     Review of Systems  Constitutional: Negative.   HENT: Negative.    Eyes:  Negative for blurred vision, discharge and redness.  Respiratory: Negative.    Cardiovascular: Negative.   Gastrointestinal:  Negative for abdominal pain.  Genitourinary: Negative.   Musculoskeletal: Negative.  Negative for myalgias.  Skin:  Negative for rash.  Neurological:  Negative for tingling, loss of consciousness and weakness.  Endo/Heme/Allergies:  Negative for polydipsia.      Objective:     BP (!) 156/96 (BP Location: Right Arm, Patient Position: Sitting, Cuff Size: Large)   Pulse (!) 45   Temp 98 F (36.7 C) (Temporal)   Ht 5\' 8"  (1.727 m)   Wt 192 lb 3.2 oz (87.2 kg)   SpO2 96%   BMI 29.22 kg/m    Physical Exam Constitutional:      General: He is not in acute distress.    Appearance: Normal appearance. He is not ill-appearing, toxic-appearing or diaphoretic.  HENT:     Head: Normocephalic and atraumatic.     Right Ear: External ear normal.     Left Ear: External ear normal.     Mouth/Throat:     Mouth: Mucous membranes are moist.     Pharynx: Oropharynx is clear. No oropharyngeal exudate or posterior  oropharyngeal erythema.  Eyes:     General: No scleral icterus.       Right eye: No discharge.        Left eye: No discharge.     Extraocular Movements: Extraocular movements intact.     Conjunctiva/sclera: Conjunctivae normal.     Pupils: Pupils are equal, round, and reactive to light.  Cardiovascular:     Rate and Rhythm: Normal rate and regular rhythm.  Pulmonary:     Effort: Pulmonary effort is normal. No respiratory distress.     Breath sounds: Normal breath sounds.  Abdominal:     General: Bowel sounds are normal. There is no distension.     Tenderness: There is no abdominal tenderness. There is no guarding.  Musculoskeletal:     Cervical back: No rigidity or tenderness.  Skin:    General: Skin is warm and dry.       Neurological:     Mental Status: He is alert and oriented to person, place, and time.  Psychiatric:        Mood and Affect: Mood normal.        Behavior: Behavior normal.     No results found for any visits on 04/02/22.    The 10-year ASCVD risk score (Arnett DK,  et al., 2019) is: 4.7%    Assessment & Plan:   Problem List Items Addressed This Visit       Musculoskeletal and Integument   Folliculitis   Relevant Medications   doxycycline (VIBRA-TABS) 100 MG tablet     Other   Encounter for hepatitis C screening test for low risk patient - Primary   Relevant Orders   Hepatitis C antibody   History of pulmonary embolus (PE)   Relevant Medications   apixaban (ELIQUIS) 5 MG TABS tablet    Return in about 1 year (around 04/03/2023), or if symptoms worsen or fail to improve.  Refill of Eliquis for lifetime prophylaxis with history of multiple PEs.  Doxy for folliculitis.  If not improved dermatology referral.  Continue healthy active lifestyle.  Fasting physical exam blood work drawn today.  TV is ready to go to the lab here  Mliss Sax, MD

## 2022-04-03 LAB — URINALYSIS, ROUTINE W REFLEX MICROSCOPIC
Bilirubin Urine: NEGATIVE
Hgb urine dipstick: NEGATIVE
Leukocytes,Ua: NEGATIVE
Nitrite: NEGATIVE
Specific Gravity, Urine: 1.015 (ref 1.000–1.030)
Total Protein, Urine: NEGATIVE
Urine Glucose: NEGATIVE
Urobilinogen, UA: 1 (ref 0.0–1.0)
pH: 6.5 (ref 5.0–8.0)

## 2022-04-03 LAB — RPR: RPR Ser Ql: NONREACTIVE

## 2022-04-03 LAB — HIV ANTIBODY (ROUTINE TESTING W REFLEX): HIV 1&2 Ab, 4th Generation: NONREACTIVE

## 2022-04-03 LAB — HEPATITIS C ANTIBODY: Hepatitis C Ab: NONREACTIVE

## 2022-04-07 ENCOUNTER — Encounter: Payer: Self-pay | Admitting: Family Medicine

## 2022-04-07 ENCOUNTER — Ambulatory Visit: Payer: Managed Care, Other (non HMO) | Admitting: Family Medicine

## 2022-04-07 VITALS — BP 140/80 | HR 54 | Temp 98.4°F | Ht 68.0 in | Wt 184.0 lb

## 2022-04-07 DIAGNOSIS — B86 Scabies: Secondary | ICD-10-CM

## 2022-04-07 DIAGNOSIS — L280 Lichen simplex chronicus: Secondary | ICD-10-CM

## 2022-04-07 MED ORDER — PERMETHRIN 5 % EX CREA: TOPICAL_CREAM | CUTANEOUS | 0 refills | Status: DC

## 2022-04-07 NOTE — Progress Notes (Signed)
   Established Patient Office Visit  Subjective   Patient ID: Stephen Farmer, male    DOB: 1980-07-26  Age: 41 y.o. MRN: [id number]  Chief Complaint  Patient presents with   Follow-up    Follow up for rash pt states he is still having the same issue and breaking out more.    HPI for 2-1/2 weeks patient has experienced a rash on his trunk, arms and upper legs.  It has been intermittently pruritic.  There is some interdigital space involvement.  It recently moved to his interdigital spaces.  Recent RPR was negative.  It has been treated with antifungals, steroids and most recently doxycycline.  It has not responded to these treatments.    Review of Systems  Constitutional: Negative.   HENT: Negative.    Eyes:  Negative for blurred vision, discharge and redness.  Respiratory: Negative.    Cardiovascular: Negative.   Gastrointestinal:  Negative for abdominal pain.  Genitourinary: Negative.   Musculoskeletal: Negative.  Negative for myalgias.  Skin:  Positive for itching and rash.  Neurological:  Negative for tingling, loss of consciousness and weakness.  Endo/Heme/Allergies:  Negative for polydipsia.      Objective:     BP (!) 140/80 (BP Location: Left Arm, Patient Position: Sitting, Cuff Size: Normal)   Pulse (!) 54   Temp 98.4 F (36.9 C) (Temporal)   Ht 5\' 8"  (1.727 m)   Wt 184 lb (83.5 kg)   SpO2 95%   BMI 27.98 kg/m    Physical Exam Constitutional:      General: He is not in acute distress.    Appearance: Normal appearance. He is not ill-appearing, toxic-appearing or diaphoretic.  HENT:     Head: Normocephalic and atraumatic.     Right Ear: External ear normal.     Left Ear: External ear normal.  Eyes:     General: No scleral icterus.       Right eye: No discharge.        Left eye: No discharge.     Extraocular Movements: Extraocular movements intact.     Conjunctiva/sclera: Conjunctivae normal.  Pulmonary:     Effort: Pulmonary effort is normal. No  respiratory distress.  Skin:    General: Skin is warm and dry.       Neurological:     Mental Status: He is alert and oriented to person, place, and time.  Psychiatric:        Mood and Affect: Mood normal.        Behavior: Behavior normal.      No results found for any visits on 04/07/22.    The 10-year ASCVD risk score (Arnett DK, et al., 2019) is: 3.3%    Assessment & Plan:   Problem List Items Addressed This Visit       Musculoskeletal and Integument   Scabies - Primary   Relevant Medications   permethrin (ELIMITE) 5 % cream   Lichen simplex    Return if symptoms worsen or fail to improve.  We will try treating with permethrin.  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  Urgent dermatology referral improved. He can discontinue the doxycycline. Libby Maw, MD

## 2022-04-15 ENCOUNTER — Other Ambulatory Visit: Payer: Self-pay | Admitting: Family Medicine

## 2022-04-15 ENCOUNTER — Telehealth: Payer: Self-pay | Admitting: Family Medicine

## 2022-04-15 DIAGNOSIS — B86 Scabies: Secondary | ICD-10-CM

## 2022-04-15 DIAGNOSIS — L309 Dermatitis, unspecified: Secondary | ICD-10-CM

## 2022-04-15 NOTE — Telephone Encounter (Signed)
Please advise message below,

## 2022-04-15 NOTE — Telephone Encounter (Signed)
Pt had an appointment with Dr. Doreene Burke on 04/07/22 and was given a cream. He was told if this cream does not work to reach out and let him know and an urgent referral would be placed. It's not working and he does want a referral to a dermatologist. Please advise pt @   [phone]

## 2022-04-16 NOTE — Telephone Encounter (Signed)
Pt is saying he can not get into his referral at [facility] that Dr. Doreene Burke placed on 04/15/22 until December and he is waning this location changed to a different facility.

## 2022-04-17 ENCOUNTER — Telehealth: Payer: Self-pay | Admitting: Family Medicine

## 2022-04-17 NOTE — Telephone Encounter (Signed)
Pt went to the derm office you referred him to but pt stated there is no openings until December. Pt stated you was suppose to look at another derm. Office . Pt stated the condition is getting worse

## 2022-04-20 NOTE — Telephone Encounter (Signed)
Patient is scheduled for [facility] for Wednesday 04/22/22 at 4:20pm. Patient is aware of appt.

## 2022-04-21 NOTE — Telephone Encounter (Signed)
Spoke Sherri who have scheduled patient for 04/22/22 patient aware of appointment.

## 2022-07-15 IMAGING — CT CT ANGIO CHEST
2 of 6 series · 18 of 36 positions shown · IV contrast (omnipaque)
Comparison: 04/27/2020

CLINICAL DATA: Worsening chest pain and shortness of breath. Recent
diagnosis of bilateral pulmonary embolism on treatment.

EXAM:
CT ANGIOGRAPHY CHEST WITH CONTRAST
TECHNIQUE: Multidetector CT imaging of the chest was performed using the
standard protocol during bolus administration of intravenous
contrast. Multiplanar CT image reconstructions and MIPs were
obtained to evaluate the vascular anatomy.
CONTRAST:  75mL OMNIPAQUE IOHEXOL 350 MG/ML SOLN

[Series 5: thins · axial · 0.67mm/px · z∈[+1384,+1644]mm · 17 of 294 slices shown]
[im 17/294  lung]
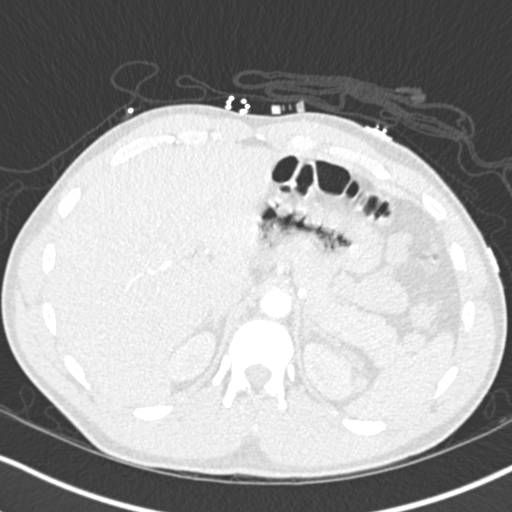
[im 33/294  mediastinal]
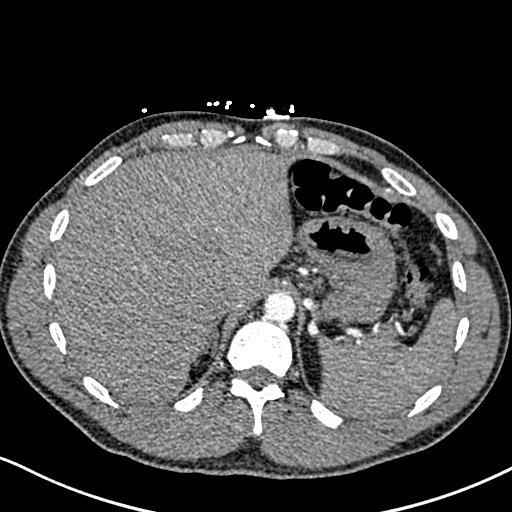
[im 49/294  lung]
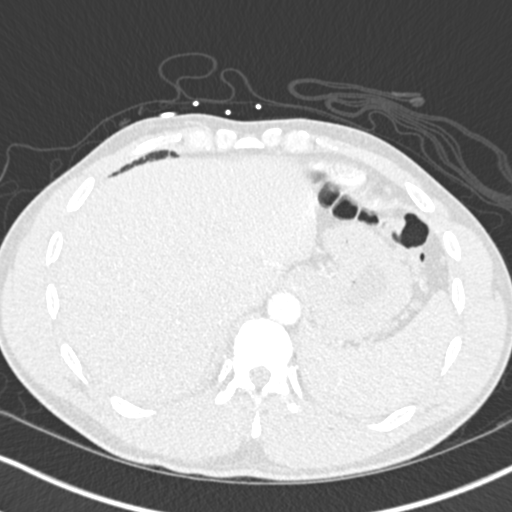
[im 66/294  mediastinal]
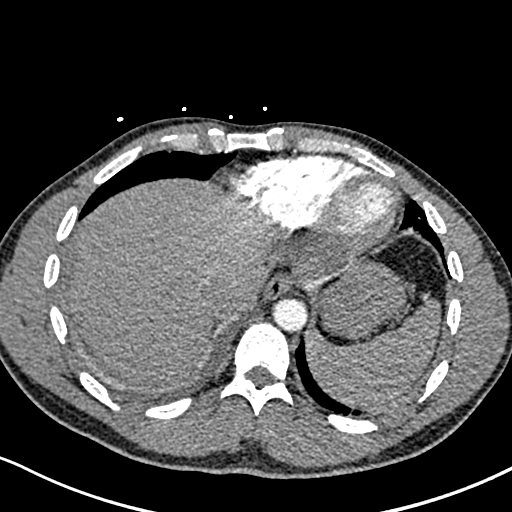
[im 82/294  lung]
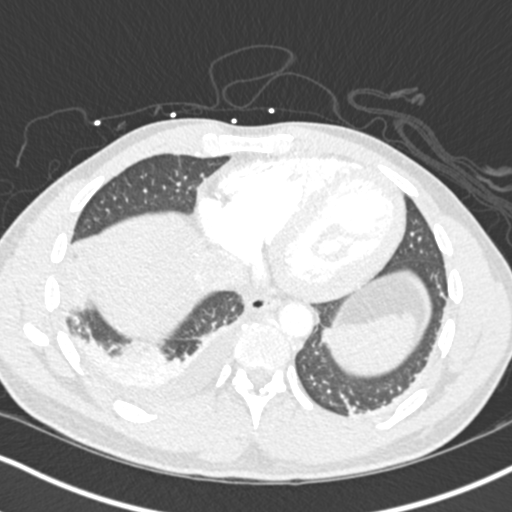
[im 98/294  mediastinal]
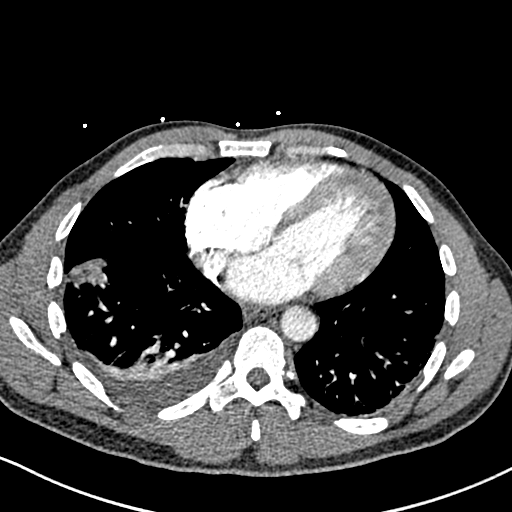
[im 114/294  lung]
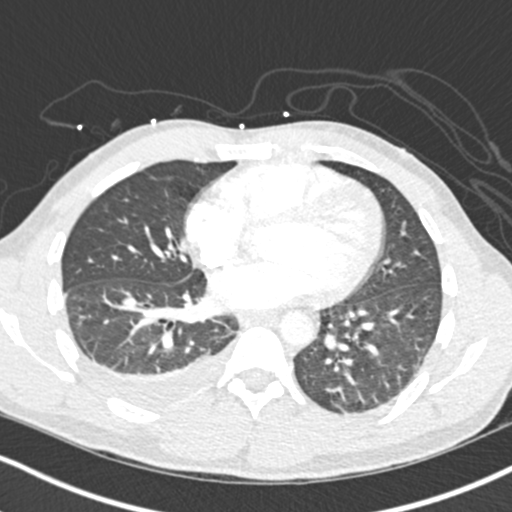
[im 131/294  mediastinal]
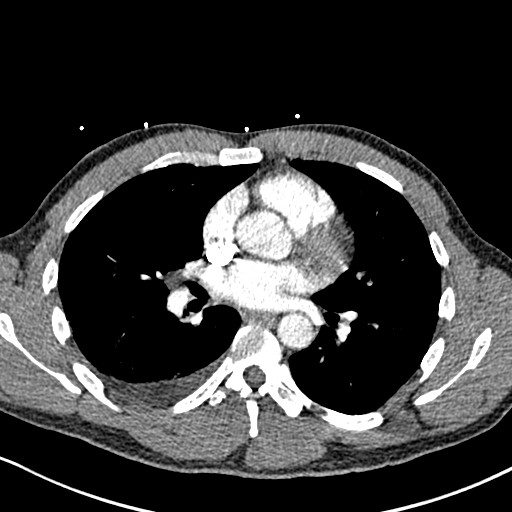
[im 147/294  lung]
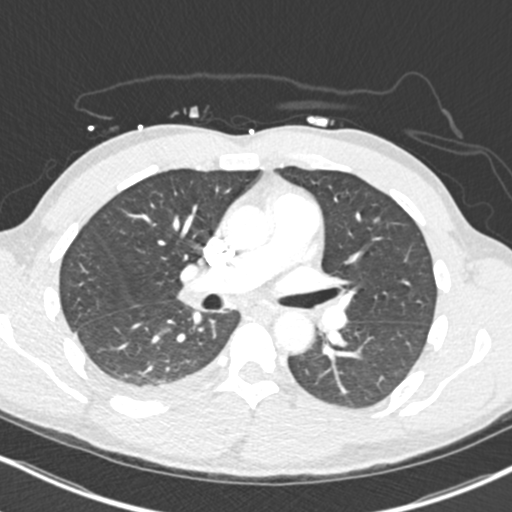
[im 163/294  mediastinal]
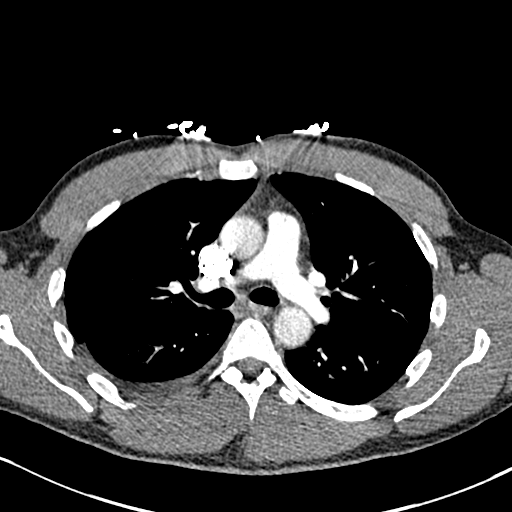
[im 180/294  lung]
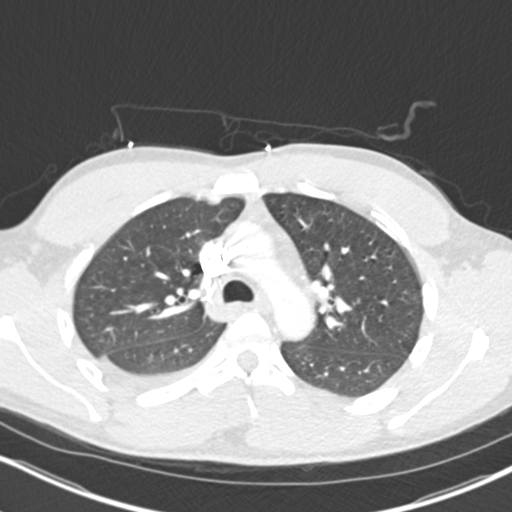
[im 196/294  mediastinal]
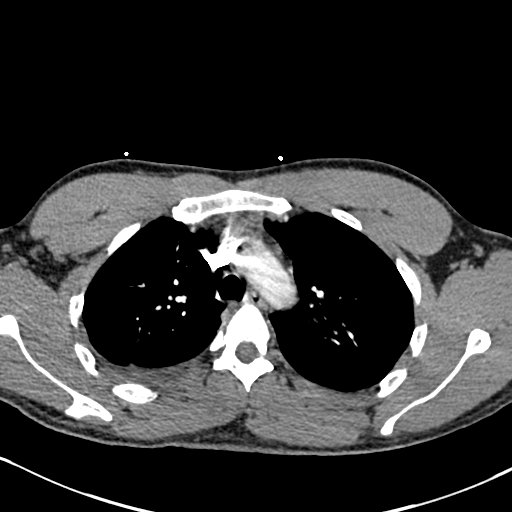
[im 212/294  lung]
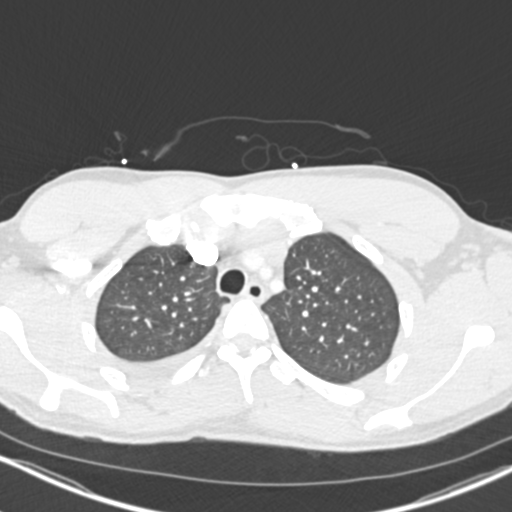
[im 228/294  mediastinal]
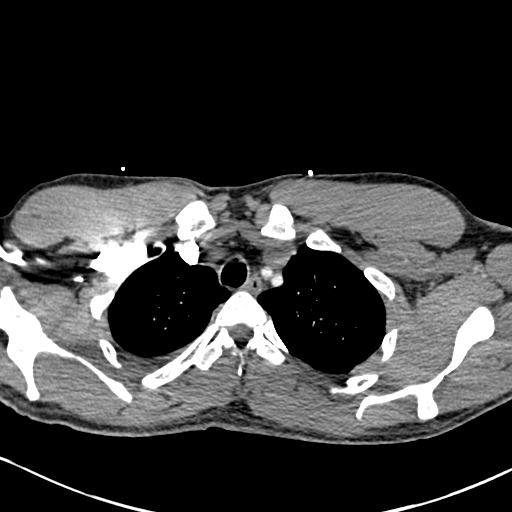
[im 245/294  lung]
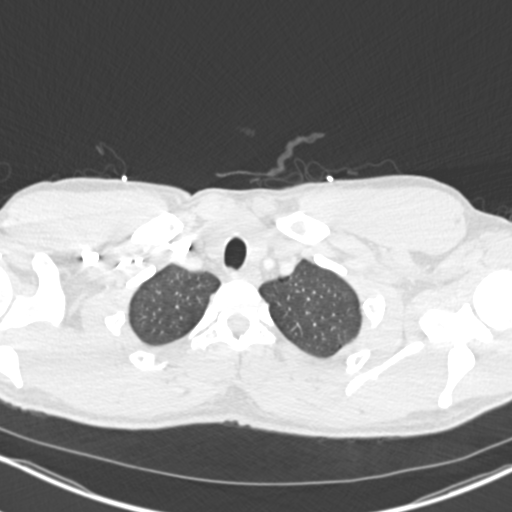
[im 261/294  mediastinal]
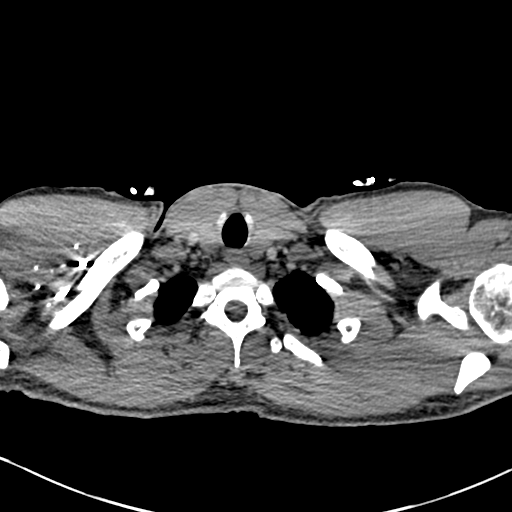
[im 277/294  lung]
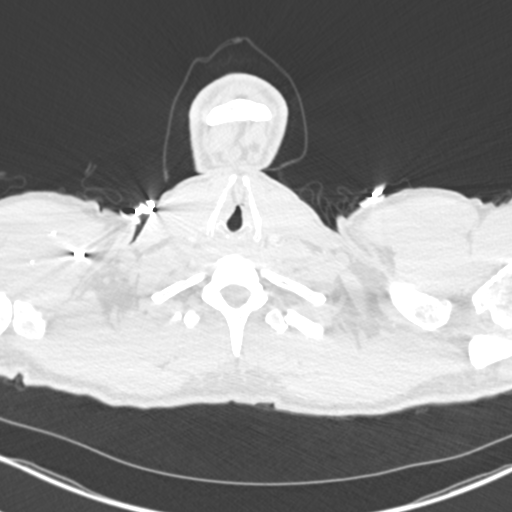

[Series 7: coronal mpr · coronal · 0.58mm/px · 1 of 124 slices shown]
[im 62/124  mediastinal]
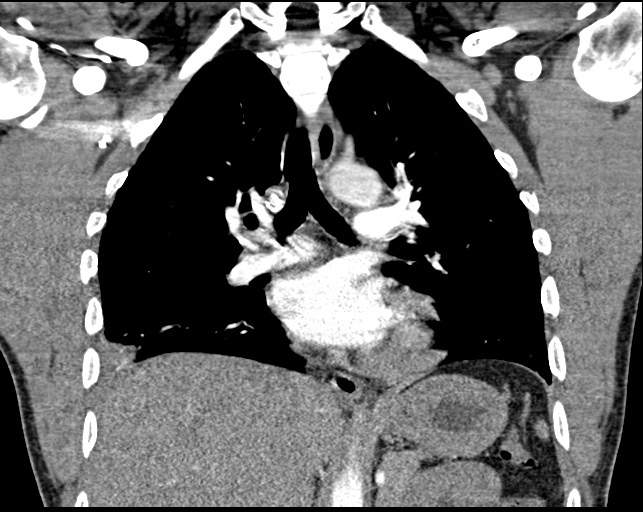

[18 of 36 positions shown; findings below may reference images not displayed]

FINDINGS: Cardiovascular: Moderately good opacification of the central and
segmental pulmonary arteries. Although small peripheral filling
defects are again demonstrated, there is significant decrease in the
visualized clot burden since the previous study consistent with
interval response to therapy. No new emboli are demonstrated. Normal
heart size. No pericardial effusions. Normal caliber thoracic aorta.
Great vessel origins are patent.

Mediastinum/Nodes: Scattered mediastinal lymph nodes are not
pathologically enlarged. Esophagus is decompressed.

Lungs/Pleura: Small but increasing right pleural effusion and
basilar atelectasis. This could represent pneumonia or compressive
atelectasis. No pneumothorax. Airways are patent.

Upper Abdomen: No acute abnormalities are demonstrated in the
visualized upper abdomen.

Musculoskeletal: No chest wall abnormality. No acute or significant
osseous findings.

Review of the MIP images confirms the above findings.
IMPRESSION: 1. There is significant decrease in the visualized clot burden since
the previous study consistent with interval response to therapy. No
new emboli are demonstrated.
2. Small but increasing right pleural effusion and basilar
atelectasis. This could represent pneumonia or compressive
atelectasis.

## 2023-05-24 ENCOUNTER — Encounter: Payer: Managed Care, Other (non HMO) | Admitting: Family Medicine

## 2023-05-24 ENCOUNTER — Telehealth: Payer: Self-pay | Admitting: Family Medicine

## 2023-05-24 NOTE — Telephone Encounter (Signed)
Pt sent mychart message to schedule a date for his cpe at 11p on 04/23/23. I reached out to pt to see if he wanted to cancel his 05/24/23, he did. He is under the weather. I took pt off the dar.

## 2023-06-03 ENCOUNTER — Encounter: Payer: Self-pay | Admitting: Family Medicine

## 2023-06-03 ENCOUNTER — Ambulatory Visit (INDEPENDENT_AMBULATORY_CARE_PROVIDER_SITE_OTHER): Payer: Managed Care, Other (non HMO) | Admitting: Family Medicine

## 2023-06-03 VITALS — BP 124/86 | HR 49 | Temp 98.0°F | Ht 68.0 in | Wt 201.8 lb

## 2023-06-03 DIAGNOSIS — Z114 Encounter for screening for human immunodeficiency virus [HIV]: Secondary | ICD-10-CM

## 2023-06-03 DIAGNOSIS — Z23 Encounter for immunization: Secondary | ICD-10-CM

## 2023-06-03 DIAGNOSIS — I2699 Other pulmonary embolism without acute cor pulmonale: Secondary | ICD-10-CM | POA: Diagnosis not present

## 2023-06-03 DIAGNOSIS — Z13220 Encounter for screening for lipoid disorders: Secondary | ICD-10-CM

## 2023-06-03 DIAGNOSIS — Z131 Encounter for screening for diabetes mellitus: Secondary | ICD-10-CM | POA: Diagnosis not present

## 2023-06-03 DIAGNOSIS — Z86711 Personal history of pulmonary embolism: Secondary | ICD-10-CM | POA: Diagnosis not present

## 2023-06-03 DIAGNOSIS — Z0000 Encounter for general adult medical examination without abnormal findings: Secondary | ICD-10-CM | POA: Diagnosis not present

## 2023-06-03 DIAGNOSIS — Z113 Encounter for screening for infections with a predominantly sexual mode of transmission: Secondary | ICD-10-CM

## 2023-06-03 MED ORDER — APIXABAN 5 MG PO TABS: ORAL_TABLET | ORAL | 2 refills | Status: AC

## 2023-06-03 NOTE — Progress Notes (Signed)
Established Patient Office Visit   Subjective:  Patient ID: Stephen Farmer, male    DOB: 30-Aug-1980  Age: 42 y.o. MRN: [id number]  Chief Complaint  Patient presents with   Annual Exam    CPE/labs.  Fasting today.  No concerns.     HPI Encounter Diagnoses  Name Primary?   Healthcare maintenance Yes   Recurrent pulmonary emboli (HCC)    Screening for diabetes mellitus    Screening for cholesterol level    Screening for HIV (human immunodeficiency virus)    Screen for STD (sexually transmitted disease)    History of pulmonary embolus (PE)    Immunization due    Here for physical.  He is fasting.  He has regular exercise for couple of hours 3 times weekly.  He has regular dental care.  He lives alone.  Family lives within a 1 to 2-hour radius.  Continues apixaban for recurrent unprovoked pulmonary embolism.   Review of Systems  Constitutional: Negative.   HENT: Negative.    Eyes:  Negative for blurred vision, discharge and redness.  Respiratory: Negative.    Cardiovascular: Negative.   Gastrointestinal:  Negative for abdominal pain.  Genitourinary: Negative.   Musculoskeletal: Negative.  Negative for myalgias.  Skin:  Negative for rash.  Neurological:  Negative for tingling, loss of consciousness and weakness.  Endo/Heme/Allergies:  Negative for polydipsia.     Current Outpatient Medications:    acetaminophen (TYLENOL) 325 MG tablet, Take 2 tablets (650 mg total) by mouth every 6 (six) hours as needed for mild pain, fever or headache (or Fever >/= 101)., Disp: , Rfl:    Ascorbic Acid (VITAMIN C ADULT GUMMIES PO), Take 1 tablet by mouth daily., Disp: , Rfl:    Hyprom-Naphaz-Polysorb-Zn Sulf (CLEAR EYES COMPLETE OP), Place 1 drop into both eyes daily as needed (dry eyes)., Disp: , Rfl:    omeprazole (PRILOSEC) 20 MG capsule, Take 20 mg by mouth daily as needed (heartburn). , Disp: , Rfl:    apixaban (ELIQUIS) 5 MG TABS tablet, Take 1 tablet twice daily., Disp: 180 tablet,  Rfl: 2   Objective:     BP 124/86   Pulse (!) 49   Temp 98 F (36.7 C) (Temporal)   Ht 5\' 8"  (1.727 m)   Wt 201 lb 12.8 oz (91.5 kg)   SpO2 100%   BMI 30.68 kg/m    Physical Exam Constitutional:      General: He is not in acute distress.    Appearance: Normal appearance. He is not ill-appearing, toxic-appearing or diaphoretic.  HENT:     Head: Normocephalic and atraumatic.     Right Ear: Tympanic membrane, ear canal and external ear normal.     Left Ear: Tympanic membrane, ear canal and external ear normal.     Mouth/Throat:     Mouth: Mucous membranes are moist.     Pharynx: Oropharynx is clear. No oropharyngeal exudate or posterior oropharyngeal erythema.  Eyes:     General: No scleral icterus.       Right eye: No discharge.        Left eye: No discharge.     Extraocular Movements: Extraocular movements intact.     Conjunctiva/sclera: Conjunctivae normal.     Pupils: Pupils are equal, round, and reactive to light.  Cardiovascular:     Rate and Rhythm: Normal rate and regular rhythm.  Pulmonary:     Effort: Pulmonary effort is normal. No respiratory distress.     Breath sounds:  Normal breath sounds.  Abdominal:     General: Bowel sounds are normal.     Tenderness: There is no abdominal tenderness. There is no guarding.     Hernia: There is no hernia in the left inguinal area or right inguinal area.  Genitourinary:    Penis: Circumcised. No hypospadias, erythema, tenderness, discharge, swelling or lesions.      Testes:        Right: Mass, tenderness or swelling not present. Right testis is descended. Cremasteric reflex is present.         Left: Mass, tenderness or swelling not present. Left testis is descended. Cremasteric reflex is present.      Epididymis:     Right: Not inflamed or enlarged.     Left: Not inflamed or enlarged.  Musculoskeletal:     Cervical back: No rigidity or tenderness.  Lymphadenopathy:     Lower Body: No right inguinal adenopathy. No left  inguinal adenopathy.  Skin:    General: Skin is warm and dry.  Neurological:     Mental Status: He is alert and oriented to person, place, and time.  Psychiatric:        Mood and Affect: Mood normal.        Behavior: Behavior normal.      No results found for any visits on 06/03/23.    The 10-year ASCVD risk score (Arnett DK, et al., 2019) is: 2.8%    Assessment & Plan:   Healthcare maintenance -     CBC -     Urinalysis, Routine w reflex microscopic  Recurrent pulmonary emboli (HCC) -     Apixaban; Take 1 tablet twice daily.  Dispense: 180 tablet; Refill: 2  Screening for diabetes mellitus -     Comprehensive metabolic panel -     Hemoglobin A1c  Screening for cholesterol level -     Comprehensive metabolic panel -     Lipid panel  Screening for HIV (human immunodeficiency virus) -     HIV Antibody (routine testing w rflx)  Screen for STD (sexually transmitted disease) -     RPR  History of pulmonary embolus (PE) -     Apixaban; Take 1 tablet twice daily.  Dispense: 180 tablet; Refill: 2  Immunization due -     Tdap vaccine greater than or equal to 7yo IM    Return in about 1 year (around 06/02/2024), or if symptoms worsen or fail to improve.  Information was given on health maintenance and disease prevention.  Information given on apixaban.  Recommended that he continue this medication for the duration with his history of recurrent unprovoked PE.  Information was given on safe sex.  Mliss Sax, MD

## 2023-06-04 ENCOUNTER — Telehealth: Payer: Self-pay | Admitting: Family Medicine

## 2023-06-04 LAB — COMPREHENSIVE METABOLIC PANEL
ALT: 19 U/L (ref 0–53)
AST: 24 U/L (ref 0–37)
Albumin: 4.7 g/dL (ref 3.5–5.2)
Alkaline Phosphatase: 62 U/L (ref 39–117)
BUN: 13 mg/dL (ref 6–23)
CO2: 26 meq/L (ref 19–32)
Calcium: 9.1 mg/dL (ref 8.4–10.5)
Chloride: 99 meq/L (ref 96–112)
Creatinine, Ser: 1.19 mg/dL (ref 0.40–1.50)
GFR: 75.61 mL/min (ref >60.00)
Glucose, Bld: 78 mg/dL (ref 70–99)
Potassium: 4 meq/L (ref 3.5–5.1)
Sodium: 135 meq/L (ref 135–145)
Total Bilirubin: 0.6 mg/dL (ref 0.2–1.2)
Total Protein: 8.1 g/dL (ref 6.0–8.3)

## 2023-06-04 LAB — URINALYSIS, ROUTINE W REFLEX MICROSCOPIC
Bilirubin Urine: NEGATIVE
Hgb urine dipstick: NEGATIVE
Ketones, ur: NEGATIVE
Leukocytes,Ua: NEGATIVE
Nitrite: NEGATIVE
RBC / HPF: NONE SEEN (ref 0–?)
Specific Gravity, Urine: 1.015 (ref 1.000–1.030)
Total Protein, Urine: NEGATIVE
Urine Glucose: NEGATIVE
Urobilinogen, UA: 0.2 (ref 0.0–1.0)
WBC, UA: NONE SEEN (ref 0–?)
pH: 5.5 (ref 5.0–8.0)

## 2023-06-04 LAB — LIPID PANEL
Cholesterol: 275 mg/dL — ABNORMAL HIGH (ref 0–200)
HDL: 58.5 mg/dL (ref >39.00)
LDL Cholesterol: 187 mg/dL — ABNORMAL HIGH (ref 0–99)
NonHDL: 216.25
Total CHOL/HDL Ratio: 5
Triglycerides: 146 mg/dL (ref 0.0–149.0)
VLDL: 29.2 mg/dL (ref 0.0–40.0)

## 2023-06-04 LAB — HEMOGLOBIN A1C: Hgb A1c MFr Bld: 5.5 % (ref 4.6–6.5)

## 2023-06-04 LAB — HIV ANTIBODY (ROUTINE TESTING W REFLEX): HIV 1&2 Ab, 4th Generation: NONREACTIVE

## 2023-06-04 LAB — RPR: RPR Ser Ql: NONREACTIVE

## 2023-06-04 NOTE — Addendum Note (Signed)
Addended by: Zada Girt D on: 06/04/2023 02:57 PM   Modules accepted: Orders

## 2023-06-21 NOTE — Telephone Encounter (Signed)
 ty

## 2023-09-23 ENCOUNTER — Ambulatory Visit: Payer: Self-pay | Admitting: Family Medicine

## 2023-09-23 NOTE — Telephone Encounter (Signed)
 Copied from CRM [id number]. Topic: Clinical - Red Word Triage >> Sep 23, 2023  1:47 PM Truddie Crumble wrote: Reason for CRM: patient returning a call and I stated it was because they wanted to get him triaged to a nurse. Patient stated he has an irregular heartbeat. Patient stated he was off his blood thinners for couple days and felt something different when he started taking his medication back. When he started taking the medication he felt better but weary   Chief Complaint: skipped heartbeat Symptoms: No Frequency: n/a Pertinent Negatives: Patient denies chest pain, shortness of breath Disposition: [] ED /[] Urgent Care (no appt availability in office) / [] Appointment(In office/virtual)/ []  [facility]/ [] Home Care/ [] Refused Recommended Disposition /[] [facility]/ []  Follow-up with PCP Additional Notes: Patient experienced "skipped beats" on Monday after not taking eliquis for 3 days. He restarted the eliquis and has since not experienced the skipped beats. Pt now feels anxious due past history of recurrent PE and would like to be evaluated. He denies chest pain, shortness of breath, heart palpitations, or any skipped beats at this time. RN offered appt for this afternoon but patient unable to arrive, appt scheduled for 4/21. Care advice given per protocol, pt verbalized understanding.    Reason for Disposition  [1] Skipped or extra beat(s) AND [2] occurs < 4 times / minute  Answer Assessment - Initial Assessment Questions 1. DESCRIPTION: "Please describe your heart rate or heartbeat that you are having" (e.g., fast/slow, regular/irregular, skipped or extra beats, "palpitations")     Skipped beats  2. ONSET: "When did it start?" (Minutes, hours or days)      Noticed 3 days ago after being off of blood thinners for 3 days  3. DURATION: "How long does it last" (e.g., seconds, minutes, hours)     "Periodically"  4. PATTERN "Does it come and go, or has it been constant since it  started?"  "Does it get worse with exertion?"   "Are you feeling it now?"     Comes and goes, felt it when laying down  5. TAP: "Using your hand, can you tap out what you are feeling on a chair or table in front of you, so that I can hear?" (Note: not all patients can do this)       No  6. HEART RATE: "Can you tell me your heart rate?" "How many beats in 15 seconds?"  (Note: not all patients can do this)       No  7. RECURRENT SYMPTOM: "Have you ever had this before?" If Yes, ask: "When was the last time?" and "What happened that time?"      Yes, was diagnosed with PE  8. CAUSE: "What do you think is causing the palpitations?"     Stopped taking eliquis for 3 days  9. CARDIAC HISTORY: "Do you have any history of heart disease?" (e.g., heart attack, angina, bypass surgery, angioplasty, arrhythmia)      Recurrent PE  10. OTHER SYMPTOMS: "Do you have any other symptoms?" (e.g., dizziness, chest pain, sweating, difficulty breathing)       No  11. PREGNANCY: "Is there any chance you are pregnant?" "When was your last menstrual period?"       N/a  Protocols used: Heart Rate and Heartbeat Questions-A-AH

## 2023-09-27 ENCOUNTER — Ambulatory Visit: Admitting: Internal Medicine

## 2023-09-27 ENCOUNTER — Encounter: Payer: Self-pay | Admitting: Internal Medicine

## 2023-09-27 VITALS — BP 140/90 | HR 51 | Temp 98.5°F | Ht 68.5 in | Wt 203.6 lb

## 2023-09-27 DIAGNOSIS — M79662 Pain in left lower leg: Secondary | ICD-10-CM | POA: Diagnosis not present

## 2023-09-27 DIAGNOSIS — R002 Palpitations: Secondary | ICD-10-CM

## 2023-09-27 LAB — CBC WITH DIFFERENTIAL/PLATELET
Basophils Absolute: 0 10*3/uL (ref 0.0–0.1)
Basophils Relative: 0.6 % (ref 0.0–3.0)
Eosinophils Absolute: 0.1 10*3/uL (ref 0.0–0.7)
Eosinophils Relative: 1.4 % (ref 0.0–5.0)
HCT: 46.2 % (ref 39.0–52.0)
Hemoglobin: 15.8 g/dL (ref 13.0–17.0)
Lymphocytes Relative: 52.5 % — ABNORMAL HIGH (ref 12.0–46.0)
Lymphs Abs: 2 10*3/uL (ref 0.7–4.0)
MCHC: 34.2 g/dL (ref 30.0–36.0)
MCV: 97.7 fl (ref 78.0–100.0)
Monocytes Absolute: 0.3 10*3/uL (ref 0.1–1.0)
Monocytes Relative: 7.5 % (ref 3.0–12.0)
Neutro Abs: 1.4 10*3/uL (ref 1.4–7.7)
Neutrophils Relative %: 38 % — ABNORMAL LOW (ref 43.0–77.0)
Platelets: 207 10*3/uL (ref 150.0–400.0)
RBC: 4.73 Mil/uL (ref 4.22–5.81)
RDW: 13.8 % (ref 11.5–15.5)
WBC: 3.8 10*3/uL — ABNORMAL LOW (ref 4.0–10.5)

## 2023-09-27 LAB — BASIC METABOLIC PANEL WITH GFR
BUN: 10 mg/dL (ref 6–23)
CO2: 32 meq/L (ref 19–32)
Calcium: 9.5 mg/dL (ref 8.4–10.5)
Chloride: 99 meq/L (ref 96–112)
Creatinine, Ser: 1.16 mg/dL (ref 0.40–1.50)
GFR: 77.79 mL/min (ref >60.00)
Glucose, Bld: 86 mg/dL (ref 70–99)
Potassium: 3.9 meq/L (ref 3.5–5.1)
Sodium: 137 meq/L (ref 135–145)

## 2023-09-27 LAB — TSH: TSH: 1.65 u[IU]/mL (ref 0.35–5.50)

## 2023-09-27 LAB — D-DIMER, QUANTITATIVE: D-Dimer, Quant: <0.19 ug{FEU}/mL (ref <0.50)

## 2023-09-27 NOTE — Progress Notes (Signed)
 [facility] PRIMARY [facility]ARE [facility] [address] [place] [address] Dept: [phone] Dept Fax: [phone]  Acute Care Office Visit  Subjective:   Stephen Farmer 07-26-1980 09/27/2023  Chief Complaint  Patient presents with   Palpitations    Asking for EKG. Last time palpitations around 12:45-12:50 in parking lot today 09/27/2023     HPI:  Discussed the use of AI scribe software for clinical note transcription with the patient, who gave verbal consent to proceed.  History of Present Illness   Stephen Farmer is a 43 year old male with a history of pulmonary embolisms who presents with shortness of breath and palpitations after missing doses of Eliquis .  He experienced shortness of breath and palpitations after missing doses of Eliquis  from April 11th to April 14th. The shortness of breath is described as 'a little harder' and is accompanied by an irregular heartbeat. These symptoms began after a trip to [place], where he did not have his medication. He resumed Eliquis  the following day, which slightly alleviated the symptoms, but he still experiences occasional palpitations.  He feels pressure in his left calf, which he associates with potential clotting. The pressure is intermittent and familiar, though not painful, and is accompanied by swelling but no warmth.  No chest pain, fever, cough, wheezing, or syncope.  He also reports no history of high blood pressure, though his family has a history of it.       The following portions of the patient's history were reviewed and updated as appropriate: past medical history, past surgical history, family history, social history, allergies, medications, and problem list.   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 No pertinent surgical history. Family History  Problem Relation Age of Onset   Hypertension Mother    Sleep apnea Father    Stroke Neg Hx     Current Outpatient Medications:    apixaban  (ELIQUIS ) 5 MG TABS tablet, Take 1 tablet twice daily., Disp: 180 tablet, Rfl: 2   Ascorbic Acid (VITAMIN C ADULT GUMMIES PO), Take 1 tablet by mouth daily., Disp: , Rfl:    Hyprom-Naphaz-Polysorb-Zn Sulf (CLEAR EYES COMPLETE OP), Place 1 drop into both eyes daily as needed (dry eyes)., Disp: , Rfl:    omeprazole (PRILOSEC) 20 MG capsule, Take 20 mg by mouth daily as needed (heartburn). , Disp: , Rfl:    acetaminophen  (TYLENOL ) 325 MG tablet, Take 2 tablets (650 mg total) by mouth every 6 (six) hours as needed for mild pain, fever or headache (or Fever >/= 101). (Patient not taking: Reported on 09/27/2023), Disp: , Rfl:  No Known Allergies   ROS: A complete ROS was performed with pertinent positives/negatives noted in the HPI. The remainder of the ROS are negative.    Objective:   Today's Vitals   09/27/23 1306  BP: (!) 140/90  Pulse: (!) 51  Temp: 98.5 F (36.9 C)  TempSrc: Temporal  SpO2: 98%  Weight: 203 lb 9.6 oz (92.4 kg)  Height: 5' 8.5" (1.74 m)    GENERAL: Well-appearing, in NAD. Well nourished.  SKIN: Pink, warm and dry. No rash, lesion, ulceration, or ecchymoses.  NECK: Trachea midline. Full ROM w/o pain or tenderness. No lymphadenopathy.  RESPIRATORY: Chest wall symmetrical. Respirations even and non-labored. Breath sounds clear to auscultation bilaterally.  CARDIAC: S1, S2 present, regular rate and rhythm. Peripheral pulses 2+ bilaterally.  MSK: Muscle tone and strength appropriate for age.  EXTREMITIES: Without clubbing, cyanosis, or edema.   NEUROLOGIC:  Steady, even gait.  PSYCH/MENTAL STATUS: Alert, oriented x 3. Cooperative, appropriate mood and affect.   EKG: sinus bradycardia.   No results found for any visits on 09/27/23.    Assessment & Plan:  Assessment and Plan    Palpitations Palpitations after missed Eliquis  doses, improved with resumption but intermittent. No chest pain, syncope, fever, cough, or wheezing. - Order EKG  - Perform blood work including CBC, BMP, TSH, and D-dimer.  Pain and pressure in left calf Intermittent calf pressure with history of clotting. Possible DVT. - Order Doppler ultrasound of the left lower extremity to rule out DVT.       No orders of the defined types were placed in this encounter.  Orders Placed This Encounter  Procedures   CBC with Differential/Platelet   Basic Metabolic Panel (BMET)   TSH   D-dimer, quantitative   EKG 12-Lead   Lab Orders         CBC with Differential/Platelet         Basic Metabolic Panel (BMET)         TSH         D-dimer, quantitative     No images are attached to the encounter or orders placed in the encounter.  Return if symptoms worsen or fail to improve.   Gavin Kast, FNP

## 2023-09-28 ENCOUNTER — Encounter: Payer: Self-pay | Admitting: Internal Medicine

## 2023-09-30 ENCOUNTER — Ambulatory Visit (HOSPITAL_BASED_OUTPATIENT_CLINIC_OR_DEPARTMENT_OTHER)

## 2023-09-30 DIAGNOSIS — M79662 Pain in left lower leg: Secondary | ICD-10-CM

## 2023-10-01 ENCOUNTER — Encounter: Payer: Self-pay | Admitting: Internal Medicine

## 2023-10-07 ENCOUNTER — Ambulatory Visit: Admitting: Family Medicine

## 2024-05-16 ENCOUNTER — Ambulatory Visit: Payer: Self-pay

## 2024-05-16 NOTE — Telephone Encounter (Signed)
 FYI Only or Action Required?: FYI only for provider: appointment scheduled on 05/19/24.  Patient was last seen in primary care on 09/27/2023 by Billy Knee, FNP.  Called Nurse Triage reporting Hip Pain.  Symptoms began several days ago.  Interventions attempted: Rest, hydration, or home remedies.  Symptoms are: unchanged.  Triage Disposition: See PCP Within 2 Weeks  Patient/caregiver understands and will follow disposition?: Yes   Copied from CRM [id number]. Topic: Clinical - Red Word Triage >> May 16, 2024 10:10 AM Alfonso HERO wrote: Red Word that prompted transfer to Nurse Triage: pain in his side. Reason for Disposition  [1] MILD pain (e.g., does not interfere with normal activities) AND [2] present > 7 days  Answer Assessment - Initial Assessment Questions Pt called in with localized R hip pain; denies pain radiating or traveling down leg. Pt reports being very active in sports and fitness; he thought maybe it was dehydration related but pain has persisted despite hydration status. Pt has not attempted any oral intervention. Pt educated on stretching and hydration. Appointment scheduled for evaluation. Patient agrees with plan of care, and will call back if anything changes, or if symptoms worsen.    1. LOCATION and RADIATION: Where is the pain located? Does the pain spread (shoot) anywhere else?     R leg; does not radiate   2. QUALITY: What does the pain feel like?  (e.g., sharp, dull, aching, burning)     Comes and goes; at times dull and other times can feel debilitating   3. SEVERITY: How bad is the pain? What does it keep you from doing?   (Scale 1-10; or mild, moderate, severe)     Comes and goes; mild   4. ONSET: When did the pain start? Does it come and go, or is it there all the time?     Comes and goes; pt states he felt like it was going away then flared again yesterday  5. WORK OR EXERCISE: Has there been any recent work or exercise that involved this  part of the body?      Yes; pt reports being very active and playing sports   6. CAUSE: What do you think is causing the hip pain?      unknown  7. AGGRAVATING FACTORS: What makes the hip pain worse? (e.g., walking, climbing stairs, running)     no  8. OTHER SYMPTOMS: Do you have any other symptoms? (e.g., back pain, pain shooting down leg,  fever, rash)     no  Protocols used: Hip Pain-A-AH

## 2024-05-16 NOTE — Telephone Encounter (Signed)
 Noted patient scheduled to see Dr. Prentiss

## 2024-05-19 ENCOUNTER — Ambulatory Visit: Admitting: Family Medicine

## 2024-05-23 ENCOUNTER — Ambulatory Visit: Admitting: Family Medicine

## 2024-05-24 ENCOUNTER — Telehealth: Payer: Self-pay | Admitting: Family Medicine

## 2024-05-24 NOTE — Telephone Encounter (Signed)
 10/07/2023 no show, 05/19/2024 same day cancel/[facility], 05/23/2024 same day cancel/[facility]  Final warning sent via mychart

## 2024-06-12 ENCOUNTER — Ambulatory Visit: Payer: Self-pay

## 2024-06-12 NOTE — Telephone Encounter (Signed)
 Appointment scheduled with Rosina 06/13/24. Dm/cma

## 2024-06-12 NOTE — Telephone Encounter (Signed)
 FYI Only or Action Required?: FYI only for provider: appointment scheduled on 06/13/2024.  Patient was last seen in primary care on 09/27/2023 by Stephen Knee, FNP.  Called Nurse Triage reporting Abscess.  Symptoms began several days ago.  Triage Disposition: See Physician Within 24 Hours  Patient/caregiver understands and will follow disposition?: Yes         Copied from CRM #[id number]. Topic: Clinical - Red Word Triage >> Jun 12, 2024 10:18 AM Stephen Farmer wrote: [facility] that prompted transfer to Nurse Triage: abscess on left  rear end cheek leaking puss with an odor Reason for Disposition  [1] Boil > 1/2 inch across (> 12 mm; larger than a marble) AND [2] center is soft or pus colored  Answer Assessment - Initial Assessment Questions This RN scheduled pt an appointment for tomorrow in office. This RN educated pt on new-worsening symptoms and when to call back. Pt verbalized understanding and agrees to plan.  Abscess on left buttock started last Fri Noticed pus and odor yesterday Denies rash, fever, pain Denies black, purple or blisters in area of boil  Protocols used: Boil (Skin Abscess)-A-AH

## 2024-06-13 ENCOUNTER — Ambulatory Visit: Admitting: Internal Medicine

## 2024-06-13 ENCOUNTER — Encounter: Payer: Self-pay | Admitting: Internal Medicine

## 2024-06-13 VITALS — BP 140/90 | HR 62 | Temp 98.9°F | Ht 68.0 in | Wt 204.2 lb

## 2024-06-13 DIAGNOSIS — L089 Local infection of the skin and subcutaneous tissue, unspecified: Secondary | ICD-10-CM | POA: Diagnosis not present

## 2024-06-13 MED ORDER — SULFAMETHOXAZOLE-TRIMETHOPRIM 800-160 MG PO TABS: 1.0000 | ORAL_TABLET | Freq: Two times a day (BID) | ORAL | 0 refills | Status: AC

## 2024-06-13 NOTE — Patient Instructions (Signed)
 Neosporin 2 times a day for 5 days.  Leave area open to air

## 2024-06-13 NOTE — Progress Notes (Signed)
 " [facility] PRIMARY CARE [facility] [address] [place] [address] Dept: [phone] Dept Fax: [phone]  Acute Care Office Visit  Subjective:   Stephen Farmer 1981-06-02 06/13/2024  Chief Complaint  Patient presents with   Blister    1/2 Abscess buttock left drainage, painful,    HPI: Discussed the use of AI scribe software for clinical note transcription with the patient, who gave verbal consent to proceed.  History of Present Illness   Stephen Farmer is a 44 year old male who presents with a possible abscess in the perianal area.  He noticed a bump on his cheek last Thursday, which progressively enlarged over the next few days.   The bump initially appeared on the cheek and seemed to move towards the anal cavity, settling in the crease per patient. By Saturday, the area became painful and larger, with the drainage and odor being more concerning than the pain.  He experienced a slight fever last night, which broke overnight, accompanied by sweating and a groggy throat. He did not take any medication for the fever. He also reported a mild headache. No runny nose, nasal congestion, or cough.   The following portions of the patient's history were reviewed and updated as appropriate: past medical history, past surgical history, family history, social history, allergies, medications, and problem list.   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 No pertinent surgical history. Family History  Problem Relation Age of Onset    Hypertension Mother    Sleep apnea Father    Stroke Neg Hx    Current Medications[1] Allergies[2]   ROS: A complete ROS was performed with pertinent positives/negatives noted in the HPI. The remainder of the ROS are negative.    Objective:   Today's Vitals   06/13/24 1009  BP: (!) 140/90  Pulse: 62  Temp: 98.9 F (37.2 C)  TempSrc: Temporal  SpO2: 98%  Weight: 204 lb 3.2 oz (92.6 kg)  Height: 5' 8 (1.727 m)    GENERAL: Well-appearing, in NAD. Well nourished.  SKIN: Pink, warm and dry.  Localized approximate 1 cm area of excoriated skin to left lower inner buttock.  No active drainage.  No mass observed with visualization or palpation.  No mass on or about anus.  RESPIRATORY: Chest wall symmetrical. Respirations even and non-labored.  Rectal exam: negative without mass, lesions or tenderness, sphincter tone normal. PSYCH/MENTAL STATUS: Alert, oriented x 3. Cooperative, appropriate mood and affect.  Chaperoned by Avelina Finder CMA  No results found for any visits on 06/13/24.    Assessment & Plan:  Assessment and Plan    Local skin infection  Superficial skin infection likely due to friction and moisture. No abscess or pilonidal cyst.  - Prescribed bactrim  BID x 7 days - Apply Neosporin to affected area twice daily for five days. - Allow area to air out at night - Return for evaluation if abscess reoccurs.       Meds ordered this encounter  Medications   sulfamethoxazole -trimethoprim  (BACTRIM  DS) 800-160 MG tablet  Sig: Take 1 tablet by mouth 2 (two) times daily for 7 days.    Dispense:  14 tablet    Refill:  0    Supervising Provider:   THOMPSON, AARON B [[id number]]   No orders of the defined types were placed in this encounter.  Lab Orders  No laboratory test(s) ordered today   No images are attached to the encounter or orders placed in the encounter.  Return if symptoms worsen or fail to improve.   Rosina Senters, FNP     [1]  Current Outpatient  Medications:    apixaban  (ELIQUIS ) 5 MG TABS tablet, Take 1 tablet twice daily., Disp: 180 tablet, Rfl: 2   Ascorbic Acid (VITAMIN C ADULT GUMMIES PO), Take 1 tablet by mouth daily., Disp: , Rfl:    Hyprom-Naphaz-Polysorb-Zn Sulf (CLEAR EYES COMPLETE OP), Place 1 drop into both eyes daily as needed (dry eyes)., Disp: , Rfl:    omeprazole (PRILOSEC) 20 MG capsule, Take 20 mg by mouth daily as needed (heartburn). , Disp: , Rfl:    sulfamethoxazole -trimethoprim  (BACTRIM  DS) 800-160 MG tablet, Take 1 tablet by mouth 2 (two) times daily for 7 days., Disp: 14 tablet, Rfl: 0   acetaminophen  (TYLENOL ) 325 MG tablet, Take 2 tablets (650 mg total) by mouth every 6 (six) hours as needed for mild pain, fever or headache (or Fever >/= 101). (Patient not taking: Reported on 09/27/2023), Disp: , Rfl:  [2] No Known Allergies  "
